# Patient Record
Sex: Female | Born: 1950 | Race: White | Hispanic: No | Marital: Married | Smoking: Never smoker
Health system: Southern US, Community
[De-identification: ages and names within clinical notes are randomized; demographics above are authoritative.]

## PROBLEM LIST (undated history)

## (undated) DIAGNOSIS — M797 Fibromyalgia: Secondary | ICD-10-CM

## (undated) DIAGNOSIS — J309 Allergic rhinitis, unspecified: Secondary | ICD-10-CM

## (undated) DIAGNOSIS — J45909 Unspecified asthma, uncomplicated: Secondary | ICD-10-CM

## (undated) DIAGNOSIS — F319 Bipolar disorder, unspecified: Secondary | ICD-10-CM

## (undated) DIAGNOSIS — J329 Chronic sinusitis, unspecified: Secondary | ICD-10-CM

## (undated) HISTORY — DX: Fibromyalgia: M79.7

## (undated) HISTORY — DX: Allergic rhinitis, unspecified: J30.9

## (undated) HISTORY — PX: TOTAL ABDOMINAL HYSTERECTOMY: SHX209

## (undated) HISTORY — PX: ABDOMINAL HYSTERECTOMY: SHX81

## (undated) HISTORY — DX: Unspecified asthma, uncomplicated: J45.909

## (undated) HISTORY — DX: Chronic sinusitis, unspecified: J32.9

## (undated) HISTORY — DX: Bipolar disorder, unspecified: F31.9

---

## 1971-04-22 DIAGNOSIS — M797 Fibromyalgia: Secondary | ICD-10-CM | POA: Insufficient documentation

## 1997-08-07 ENCOUNTER — Ambulatory Visit: Admission: RE | Admit: 1997-08-07 | Discharge: 1997-08-07 | Payer: Self-pay

## 1999-04-22 ENCOUNTER — Encounter: Admission: RE | Admit: 1999-04-22 | Discharge: 1999-04-22 | Payer: Self-pay | Admitting: Family Medicine

## 1999-04-22 ENCOUNTER — Encounter: Payer: Self-pay | Admitting: Family Medicine

## 1999-05-06 ENCOUNTER — Encounter: Admission: RE | Admit: 1999-05-06 | Discharge: 1999-05-06 | Payer: Self-pay | Admitting: Family Medicine

## 1999-05-06 ENCOUNTER — Encounter: Payer: Self-pay | Admitting: Family Medicine

## 1999-10-17 ENCOUNTER — Encounter: Admission: RE | Admit: 1999-10-17 | Discharge: 1999-10-17 | Payer: Self-pay | Admitting: Family Medicine

## 1999-10-17 ENCOUNTER — Encounter: Payer: Self-pay | Admitting: Family Medicine

## 2000-04-30 ENCOUNTER — Encounter: Payer: Self-pay | Admitting: Family Medicine

## 2000-04-30 ENCOUNTER — Encounter: Admission: RE | Admit: 2000-04-30 | Discharge: 2000-04-30 | Payer: Self-pay | Admitting: Family Medicine

## 2001-05-15 ENCOUNTER — Encounter: Admission: RE | Admit: 2001-05-15 | Discharge: 2001-05-15 | Payer: Self-pay | Admitting: Unknown Physician Specialty

## 2001-05-15 ENCOUNTER — Encounter: Payer: Self-pay | Admitting: Unknown Physician Specialty

## 2002-06-18 ENCOUNTER — Encounter: Payer: Self-pay | Admitting: Unknown Physician Specialty

## 2002-06-18 ENCOUNTER — Encounter: Admission: RE | Admit: 2002-06-18 | Discharge: 2002-06-18 | Payer: Self-pay | Admitting: Unknown Physician Specialty

## 2005-09-29 HISTORY — PX: JOINT REPLACEMENT: SHX530

## 2010-05-21 ENCOUNTER — Encounter: Payer: Self-pay | Admitting: Unknown Physician Specialty

## 2010-12-01 DIAGNOSIS — R0789 Other chest pain: Secondary | ICD-10-CM | POA: Insufficient documentation

## 2010-12-01 DIAGNOSIS — E785 Hyperlipidemia, unspecified: Secondary | ICD-10-CM | POA: Insufficient documentation

## 2011-01-11 ENCOUNTER — Ambulatory Visit (INDEPENDENT_AMBULATORY_CARE_PROVIDER_SITE_OTHER): Payer: Medicare Other | Admitting: Psychiatry

## 2011-01-11 DIAGNOSIS — F5105 Insomnia due to other mental disorder: Secondary | ICD-10-CM

## 2011-01-11 DIAGNOSIS — F489 Nonpsychotic mental disorder, unspecified: Secondary | ICD-10-CM

## 2011-01-11 DIAGNOSIS — F314 Bipolar disorder, current episode depressed, severe, without psychotic features: Secondary | ICD-10-CM

## 2011-01-25 ENCOUNTER — Encounter (INDEPENDENT_AMBULATORY_CARE_PROVIDER_SITE_OTHER): Payer: Medicare Other | Admitting: Psychiatry

## 2011-01-25 DIAGNOSIS — F332 Major depressive disorder, recurrent severe without psychotic features: Secondary | ICD-10-CM

## 2011-02-24 ENCOUNTER — Encounter (INDEPENDENT_AMBULATORY_CARE_PROVIDER_SITE_OTHER): Payer: Medicare Other | Admitting: Psychiatry

## 2011-02-24 DIAGNOSIS — F314 Bipolar disorder, current episode depressed, severe, without psychotic features: Secondary | ICD-10-CM

## 2011-02-26 ENCOUNTER — Encounter (HOSPITAL_COMMUNITY): Payer: Self-pay | Admitting: Psychiatry

## 2011-03-02 DIAGNOSIS — J329 Chronic sinusitis, unspecified: Secondary | ICD-10-CM

## 2011-03-02 HISTORY — DX: Chronic sinusitis, unspecified: J32.9

## 2011-03-08 ENCOUNTER — Other Ambulatory Visit (HOSPITAL_COMMUNITY): Payer: Self-pay | Admitting: Psychiatry

## 2011-03-08 DIAGNOSIS — F319 Bipolar disorder, unspecified: Secondary | ICD-10-CM

## 2011-03-08 MED ORDER — QUETIAPINE FUMARATE ER 150 MG PO TB24: ORAL_TABLET | ORAL | Status: DC

## 2011-03-09 ENCOUNTER — Encounter (HOSPITAL_COMMUNITY): Payer: Self-pay | Admitting: Psychology

## 2011-03-27 ENCOUNTER — Ambulatory Visit (INDEPENDENT_AMBULATORY_CARE_PROVIDER_SITE_OTHER): Payer: Medicare Other | Admitting: Psychiatry

## 2011-03-27 ENCOUNTER — Encounter (HOSPITAL_COMMUNITY): Payer: Self-pay | Admitting: Psychiatry

## 2011-03-27 VITALS — BP 126/66 | HR 55 | Ht 66.0 in | Wt 179.0 lb

## 2011-03-27 DIAGNOSIS — F3176 Bipolar disorder, in full remission, most recent episode depressed: Secondary | ICD-10-CM

## 2011-03-27 DIAGNOSIS — Z79899 Other long term (current) drug therapy: Secondary | ICD-10-CM

## 2011-03-27 MED ORDER — BUPROPION HCL ER (XL) 150 MG PO TB24: 150.0000 mg | ORAL_TABLET | ORAL | Status: DC

## 2011-03-27 NOTE — Progress Notes (Signed)
Pt is here with husband  She is very well groomed, very happy, smiling and spontaneous speech. She has good eye contact.  She is very organized with logical, historical family data.  She accounts for numerous relatives with bipolar disorder, depression with suicides [2].  She says she has been feeling well and that [employer] has put her on their assistance program.   She has begun driving again. She plans to see her PCP soon.  She has no problem with tThe medications. She is going to her mother's for Christmas and will be out of state.  She will return in Jan. 2013

## 2011-03-27 NOTE — Patient Instructions (Signed)
Have pharmacist call if Rx has not  Printed there   Return in  2 month

## 2011-04-04 ENCOUNTER — Other Ambulatory Visit (HOSPITAL_COMMUNITY): Payer: Self-pay | Admitting: Psychiatry

## 2011-04-04 DIAGNOSIS — F3160 Bipolar disorder, current episode mixed, unspecified: Secondary | ICD-10-CM | POA: Insufficient documentation

## 2011-04-04 DIAGNOSIS — F3176 Bipolar disorder, in full remission, most recent episode depressed: Secondary | ICD-10-CM

## 2011-04-04 DIAGNOSIS — F319 Bipolar disorder, unspecified: Secondary | ICD-10-CM

## 2011-04-04 MED ORDER — TRAZODONE HCL 100 MG PO TABS: 100.0000 mg | ORAL_TABLET | Freq: Every day | ORAL | Status: DC

## 2011-04-04 MED ORDER — QUETIAPINE FUMARATE ER 150 MG PO TB24: ORAL_TABLET | ORAL | Status: DC

## 2011-04-04 MED ORDER — BUPROPION HCL ER (XL) 150 MG PO TB24: 150.0000 mg | ORAL_TABLET | Freq: Every day | ORAL | Status: DC

## 2011-04-10 ENCOUNTER — Encounter (HOSPITAL_COMMUNITY): Payer: Self-pay

## 2011-04-10 NOTE — Telephone Encounter (Signed)
This encounter was created in error - please disregard.

## 2011-05-01 ENCOUNTER — Telehealth (HOSPITAL_COMMUNITY): Payer: Self-pay

## 2011-05-01 DIAGNOSIS — F3111 Bipolar disorder, current episode manic without psychotic features, mild: Secondary | ICD-10-CM

## 2011-05-01 NOTE — Telephone Encounter (Signed)
Pt states she is having racing thoughts and episode of being manic. I explained we do not do crisis appointments that she would need to go to the ED and be evaluated. She states she was not having SI or HI. Would like you to call her please.

## 2011-05-01 NOTE — Telephone Encounter (Signed)
Anna Tanner calls this morning and call as returned. She states that over the past few days she has noticed that she has had more racing thoughts uncontrollable manic behaviors and is asking what she should do. Her medications are reviewed and it is suggested for the next 2 days that she should increase her Seroquel X. are 150 mg 2 pills daily and she may increase her trazodone 100 mg been optional one and a half tablets or 2 tablets depending on call much control for sleep she will need. She is also asked to call Wednesday morning to report how she is doing. She is reminded that she has the crisis card and may use that after hours and on the New Year's Day holiday. She agrees to return the call Wednesday morning. Anna Tanner has been doing very well with significant control and improvement of her depressed mood. It is possible that the holiday activities have precipitated more manic, disturbed thoughts.  If necessary, medications will be reevaluated or augmented with a stronger comfortable SGA. Such as Zyprexa or Risperdal.

## 2011-05-03 ENCOUNTER — Telehealth (HOSPITAL_COMMUNITY): Payer: Self-pay

## 2011-05-03 NOTE — Telephone Encounter (Signed)
Anna Tanner is called to report that he we'll ordered a prescription of Seroquel X. are 150 mg take 1 tablet every and every evening 2 hours prior to sleep has arrived. She is invited to picked this prescription bottle up at her convenience. She also has called in this morning to report that the increase in Seroquel X. are to 2 tabs of 150 mg has helped control her manic symptoms. She feels calm and in control this morning. She says this medication will be sufficient to last until her next appointment. She has a cheerful tone of voice and reports that she feels very much in control. Prescription details will be adjusted at the next appointment

## 2011-05-29 ENCOUNTER — Ambulatory Visit (INDEPENDENT_AMBULATORY_CARE_PROVIDER_SITE_OTHER): Payer: No Typology Code available for payment source | Admitting: Psychiatry

## 2011-05-29 ENCOUNTER — Encounter (HOSPITAL_COMMUNITY): Payer: Self-pay | Admitting: Psychiatry

## 2011-05-29 DIAGNOSIS — G4700 Insomnia, unspecified: Secondary | ICD-10-CM

## 2011-05-29 DIAGNOSIS — F3176 Bipolar disorder, in full remission, most recent episode depressed: Secondary | ICD-10-CM

## 2011-05-29 MED ORDER — QUETIAPINE FUMARATE 100 MG PO TABS: 100.0000 mg | ORAL_TABLET | Freq: Two times a day (BID) | ORAL | Status: DC

## 2011-05-29 MED ORDER — TRAZODONE HCL 150 MG PO TABS: 150.0000 mg | ORAL_TABLET | Freq: Every day | ORAL | Status: DC

## 2011-05-29 MED ORDER — BUPROPION HCL ER (XL) 150 MG PO TB24: 150.0000 mg | ORAL_TABLET | Freq: Every day | ORAL | Status: DC

## 2011-05-29 NOTE — Progress Notes (Signed)
Patient ID: Anna Tanner, female   DOB: March 11, 1951, 61 y.o.   MRN: [id number] Concettina is a very happy person. She presents with a smiling affect and cheerful mood. She is very positive about the way in which her depression has been over calm and her manic, racing thoughts have been controlled. She feels that she is in an optimal control state at this time. She has resumed responsibility for managing all of her medications, is able to do her housework and usual routine, and she has resumed driving. This is relieved her husband of many of the responsibilities he assumed when she became so severely depressed. They're both pleased with the current outcome she has cut back her Seroquel to just 100 mg daily and feels that this has been a steady control of her mood. She denies any suicidal homicidal ideation and looks forward to continuing in this positive mood. She agrees to call the office or go to the emergency call numbers if she has any drastic change in her mental status.

## 2011-05-29 NOTE — Patient Instructions (Signed)
You have been given prescriptions for trazodone and Wellbutrin that will be at the [facility] pharmacy. They have refills of 2 or 3. B. Seroquel 100 mg will be faxed to be appropriate pharmacy that is dispensing mad and yelling at church the office after which she may pick it up. He is being given 3 refills on that. You have denied any suicidal or manic episodes which is something they celebrate and please remember to call [facility] [phone] or use the 800 hotline if you have any extreme distress mania racing thoughts or suicidal/homicidal thoughts.

## 2011-06-08 ENCOUNTER — Telehealth (HOSPITAL_COMMUNITY): Payer: Self-pay

## 2011-06-09 NOTE — Telephone Encounter (Signed)
Script was already faxed for reorder

## 2011-06-14 ENCOUNTER — Other Ambulatory Visit (HOSPITAL_COMMUNITY): Payer: Self-pay | Admitting: Psychiatry

## 2011-06-19 ENCOUNTER — Telehealth (HOSPITAL_COMMUNITY): Payer: Self-pay

## 2011-06-19 NOTE — Telephone Encounter (Signed)
Pt has not received serequel in the mail yet and she will run out tonight. She states she cannot afford to pick up the ones you called in because for 5 pills it would be $150 and she cannot afford that. Please call

## 2011-06-20 ENCOUNTER — Other Ambulatory Visit (HOSPITAL_COMMUNITY): Payer: Self-pay | Admitting: Psychiatry

## 2011-06-20 DIAGNOSIS — F319 Bipolar disorder, unspecified: Secondary | ICD-10-CM

## 2011-06-20 MED ORDER — QUETIAPINE FUMARATE ER 150 MG PO TB24: 150.0000 mg | ORAL_TABLET | Freq: Two times a day (BID) | ORAL | Status: DC

## 2011-06-20 NOTE — Progress Notes (Signed)
[employer]  Contacted for patient assistance program.  Application is to be completed for 2013  Pt is contact for required documentation

## 2011-07-14 ENCOUNTER — Ambulatory Visit (INDEPENDENT_AMBULATORY_CARE_PROVIDER_SITE_OTHER): Payer: No Typology Code available for payment source | Admitting: Psychiatry

## 2011-07-14 ENCOUNTER — Encounter (HOSPITAL_COMMUNITY): Payer: Self-pay | Admitting: Psychiatry

## 2011-07-14 ENCOUNTER — Telehealth (HOSPITAL_COMMUNITY): Payer: Self-pay

## 2011-07-14 VITALS — BP 124/74 | HR 62 | Ht 65.0 in | Wt 180.0 lb

## 2011-07-14 DIAGNOSIS — F319 Bipolar disorder, unspecified: Secondary | ICD-10-CM

## 2011-07-14 MED ORDER — QUETIAPINE FUMARATE ER 150 MG PO TB24: 150.0000 mg | ORAL_TABLET | Freq: Every morning | ORAL | Status: DC

## 2011-07-14 MED ORDER — QUETIAPINE FUMARATE ER 200 MG PO TB24: 200.0000 mg | ORAL_TABLET | Freq: Every day | ORAL | Status: DC

## 2011-07-14 NOTE — Progress Notes (Signed)
[facility] MD Progress Note  07/14/2011 10:28 AM  IDENTIFYING DATA: Anna Tanner  IS A 61 YRS OLD WHITE  FEMALE. CLINICAL HISTORY PRESENTING CHIEF COMPLAINT:  "I was overmedicated"   HISTORY OF CURRENT ILLNESS: Anna Tanner  is a 61 y/o female with a past psychiatric history significant for Bipolar I Disorder. The patient is referred for psychiatric services for medication management.  The patient reports no current stressors.  Her husband is here and reports some irritabilty for the past two weeks. In the area of affective symptoms, patient appears euthymic but reports irritability herself.  Patient denies current suicidal ideation, intent, or plan. Patient denies current homicidal ideation, intent, or plan. Patient denies auditory hallucinations. Patient denies visual hallucinations. Patient denies symptoms of paranoia. Patient states sleep is good, with approximately 9-12 hours of sleep per night. Appetite is fair. Energy level is poor. Patient endorses symptoms of anhedonia. Patient endorses hopelessness, helplessness, or guilt.   Denies any recent episodes consistent with mania, particularly decreased need for sleep with increased energy, grandiosity, impulsivity, hyperverbal and pressured speech, or increased productivity. Denies any recent symptoms consistent with psychosis, particularly auditory or visual hallucinations, thought broadcasting/insertion/withdrawal, or ideas of reference. Also denies excessive worry to the point of physical symptoms as well as any panic attacks. Denies any history of trauma or symptoms consistent with PTSD such as flashbacks, nightmares, hypervigilance, feelings of numbness or inability to connect with others.     PAST PSYCHIATRIC HISTORY:  Patient has been admitted twice times to an inpatient psychiatric facility.  Her last admission was on 3 years ago.  Patient has never attempted suicide, but has suicidal ideation 2.5 years ago.  Patient has had outpatient  mental health treatment since 2011.  Patient has not attended 28 day programs.  Patient has not attended AA programs.  Patient has not attended NA programs.  Patient has had a trial of the following psychotropic medications:  Lithium-felt sick; Abilify at 15 mg-didn't work. Patient is currently on the following psychotropic medications:  Quetiapine, Bupropion, Trazodone  HISTORY OF SUICIDAL ACTS AND SELF-HARM:  None.   HISTORY OF VIOLENCE/ASSAULTING OTHERS/LEGAL PROBLEMS: No current legal issues.   SUBSTANCE USE HISTORY:  Caffeine: Coffee 1 cups per day. Nicotine: Patient denies.  Alcohol: Patient denies.  Illicit Drugs: Patient denies.   MENTAL ILLNESS AND SUBSTANCE ABUSE IN FAMILY MEMBERS:  Psychiatric illness: Bipolar Disorder-Father and two brothers; Mother-Depression Substance abuse: Illicit drug-one brother; Ship broker Suicides: Brother-suicidal gesture    MILITARY HISTORY: None  MEDICAL INFORMATION  a) CURRENT MEDICAL PROBLEMS:  Past Medical History  Diagnosis Date  . Sinusitis 03/02/11    affects all sinuses     b) CURRENT SIGNIFICANT PAIN PROBLEMS: Yes-fibromyalgia  c) NUTRITION ASSESSMENT: well nourished d) CURRENT PRESCRIBED MEDICATIONS:  Current Medications: Current Outpatient Prescriptions  Medication Sig Dispense Refill  . azelastine (ASTELIN) 137 MCG/SPRAY nasal spray       . baclofen (LIORESAL) 10 MG tablet       . buPROPion (WELLBUTRIN XL) 150 MG 24 hr tablet Take 1 tablet (150 mg total) by mouth daily after breakfast.  30 tablet  2  . lovastatin (MEVACOR) 20 MG tablet Take 20 mg by mouth once.      Marland Kitchen QUEtiapine Fumarate (SEROQUEL XR) 150 MG 24 hr tablet Take 1 tablet (150 mg total) by mouth 2 (two) times daily.  60 tablet  11  . traMADol (ULTRAM) 50 MG tablet       . traZODone (DESYREL) 150 MG tablet Take 150  mg by mouth once. Take tablets two at bedtime      . donepezil (ARICEPT) 10 MG tablet       . doxycycline (VIBRAMYCIN) 100 MG  capsule       . GAVILYTE-N WITH FLAVOR PACK 420 G solution       . ipratropium (ATROVENT) 0.06 % nasal spray       . levofloxacin (LEVAQUIN) 500 MG tablet       . methylphenidate (RITALIN) 10 MG tablet       . VENTOLIN HFA 108 (90 BASE) MCG/ACT inhaler        e) CURRENT OTC MEDICATIONS  None ALLERGIES/ ADVERSE REACTIONS:  Allergies  Allergen Reactions  . Butazolidin (Phenylbutazone) Other (See Comments)    Oral ulcers  . Morphine And Related Nausea And Vomiting  . Nsaids Other (See Comments)    Oral ulcers  . Pseudoephedrine Swelling    Tongue swells    Vital Signs:Blood pressure 124/74, pulse 62, height 5\' 5"  (1.651 m), weight 180 lb (81.647 kg).  Current Medications: Current Outpatient Prescriptions  Medication Sig Dispense Refill  . azelastine (ASTELIN) 137 MCG/SPRAY nasal spray       . baclofen (LIORESAL) 10 MG tablet       . buPROPion (WELLBUTRIN XL) 150 MG 24 hr tablet Take 1 tablet (150 mg total) by mouth daily after breakfast.  30 tablet  2  . lovastatin (MEVACOR) 20 MG tablet Take 20 mg by mouth once.      Marland Kitchen QUEtiapine Fumarate (SEROQUEL XR) 150 MG 24 hr tablet Take 1 tablet (150 mg total) by mouth 2 (two) times daily.  60 tablet  11  . traMADol (ULTRAM) 50 MG tablet       . traZODone (DESYREL) 150 MG tablet Take 150 mg by mouth once. Take tablets two at bedtime      . donepezil (ARICEPT) 10 MG tablet       . doxycycline (VIBRAMYCIN) 100 MG capsule       . GAVILYTE-N WITH FLAVOR PACK 420 G solution       . ipratropium (ATROVENT) 0.06 % nasal spray       . levofloxacin (LEVAQUIN) 500 MG tablet       . methylphenidate (RITALIN) 10 MG tablet       . VENTOLIN HFA 108 (90 BASE) MCG/ACT inhaler         Lab Results: No results found for this or any previous visit (from the past 48 hour(s)).  MENTAL STATUS EXAM: Mental Status Examination/Evaluation: Objective:  Appearance: Well Groomed  Eye Contact::  Good  Speech:  Clear and Coherent  Volume:  Normal  Mood:   Depressed and Dysphoric  Affect:  Appropriate  Thought Process:  Coherent, Intact, Linear and Logical  Orientation:  Full  Thought Content:  WDL  Suicidal Thoughts:  No  Homicidal Thoughts:  No  Memory:  Immediate;   Good Recent;   Good Remote;   Good  Judgement:  Good  Insight:  Fair  Psychomotor Activity:  Normal  Concentration:  Good  Recall:  Good  Akathisia:  No  Handed:  Left  AIMS (if indicated):     Assets:  Communication Skills Desire for Improvement Housing Resilience Social Support     ASSESSMENT:  Diagnosis:   Axis I: Bipolar, Depressed Axis II: No diagnosis Axis III:  Past Medical History  Diagnosis Date  . Sinusitis 03/02/11    affects all sinuses  Axis IV: None Axis V: 51-60 moderate  symptoms  PLAN:   1. Affirm with the patient that the medications are taken as ordered. Patient  expressed understanding of how their medications were to be used.  2. Continue the following psychiatric medications as written prior to this appointment with the following changes:  a) Patient advised that she could discontinue ritalin as she has not been taking this medication. B) will increase Seroquel XR to 150 mg QAM and 200 mg QHS. I have asked the patient to call the clinic in one week regarding any improvement.  Will consider further increase by 50 mg weekly according to response and side effect tolerance. 3. Therapy: brief supportive therapy provided. Continue current services. discussed psychosocial stressors. 4. Risks and benefits, side effects and alternatives discussed with patient,she was given an opportunity to  ask questions about her medication, illness, and treatment. All current psychiatric medications have been reviewed and discussed with the patient and adjusted as clinically appropriate. The patient has been provided an accurate and updated list of the medications being now prescribed.  5. Patient told to call clinic if any problems occur. Patient advised to  go to ER  if she should develop SI/HI, side effects, or if symptoms worsen. Has crisis numbers to call if needed.   6. No labs warranted at this time. Will order fasting Blood glucose, HbA1c, and fasting Lipid profile for next visit. Will order EKG, depakote level, lithium level, tegretol level, cbc. Cmp, bmp. Liver function tests.  7. The patient was encouraged to keep all PCP and specialty clinic appointments.  8. Patient was instructed to return to clinic in 1 months.  9. . The patient expressed understanding of the plan outlined above and agrees with the plan.   Emree Locicero 07/14/2011, 10:28 AM

## 2011-07-17 ENCOUNTER — Telehealth (HOSPITAL_COMMUNITY): Payer: Self-pay

## 2011-07-18 ENCOUNTER — Telehealth (HOSPITAL_COMMUNITY): Payer: Self-pay | Admitting: Psychiatry

## 2011-07-18 MED ORDER — QUETIAPINE FUMARATE ER 200 MG PO TB24: 200.0000 mg | ORAL_TABLET | Freq: Every day | ORAL | Status: DC

## 2011-07-18 MED ORDER — TRAZODONE HCL 150 MG PO TABS: 150.0000 mg | ORAL_TABLET | Freq: Once | ORAL | Status: DC

## 2011-07-18 NOTE — Telephone Encounter (Signed)
The patient has reported that she feeling a little better with Seroquel XR 150 mg QAM and 200 mg.  She reports that she sleeps well with Trazodone 300 mg QHS, but will run out in a few days.  She reports that she does not feel sedated with this medication.  Plan:  1. Will increase Seroquel XR to 200 mg BID. 2. Will consider taper and possible DC of  Wellbutrin. The patient reports that Wellbutrin were started simultaneously and as the patient has insomnia and her symptoms of Bipolar Disorder are not well controlled a trial of Seroquel alone at itt's maximum tolerated dosage is warranted.

## 2011-07-18 NOTE — Telephone Encounter (Signed)
Will call patient and discuss the option for Seroquel immediate release for this patient.

## 2011-07-18 NOTE — Telephone Encounter (Signed)
Patient could not afford to pay for 200 mg XR prescription. Will fax increased dosage to [employer].

## 2011-07-20 ENCOUNTER — Telehealth (HOSPITAL_COMMUNITY): Payer: Self-pay | Admitting: Psychiatry

## 2011-07-20 NOTE — Telephone Encounter (Signed)
Called Anna Tanner she reports some constipation but also reports less irritatbility and reduction of depressive symptoms with increased dosage of quetiapine. She is currently taking Seroquel XR 200 mg BID.

## 2011-07-27 ENCOUNTER — Telehealth (HOSPITAL_COMMUNITY): Payer: Self-pay | Admitting: Psychiatry

## 2011-07-27 NOTE — Telephone Encounter (Signed)
Patient reports that she is taking 400 mg total. She denies sedation, SI/HI/AVH or medications side effects. She states she has constipation, which is improving. Unsure if this is due to seroquel at this time.

## 2011-07-31 ENCOUNTER — Emergency Department
Admission: EM | Admit: 2011-07-31 | Discharge: 2011-07-31 | Disposition: A | Discharge status: Home / Self Care | Payer: Medicare Other | Source: Home / Self Care

## 2011-07-31 ENCOUNTER — Encounter: Payer: Self-pay | Admitting: *Deleted

## 2011-07-31 ENCOUNTER — Telehealth (HOSPITAL_COMMUNITY): Payer: Self-pay | Admitting: Psychiatry

## 2011-07-31 DIAGNOSIS — S6980XA Other specified injuries of unspecified wrist, hand and finger(s), initial encounter: Secondary | ICD-10-CM

## 2011-07-31 DIAGNOSIS — S6990XA Unspecified injury of unspecified wrist, hand and finger(s), initial encounter: Secondary | ICD-10-CM

## 2011-07-31 DIAGNOSIS — M79644 Pain in right finger(s): Secondary | ICD-10-CM

## 2011-07-31 MED ORDER — TRAMADOL HCL 50 MG PO TABS: 50.0000 mg | ORAL_TABLET | Freq: Four times a day (QID) | ORAL | Status: AC | PRN

## 2011-07-31 NOTE — ED Provider Notes (Signed)
History     CSN: [id number]  Arrival date & time 07/31/11  1353   None     Chief Complaint  Patient presents with  . Finger Injury    Rt hand ring finger    (Consider location/radiation/quality/duration/timing/severity/associated sxs/prior treatment) HPI NAYELLY LAUGHMAN is a 61 y.o. female who sustained a right fifth digit injury today. Mechanism of injury:  Reports catching fall against car door with right hand. Immediate symptoms include pain and bleeding at distal fifth digit distally.  Symptoms have been unchanged since that time. Denies decreased finger function, no numbness or tingling.  No prior history of related finger problems.  Bandaid applied prior to arrival.     Past Medical History  Diagnosis Date  . Sinusitis 03/02/11    affects all sinuses    History reviewed. No pertinent past surgical history.  Family History  Problem Relation Age of Onset  . Bipolar disorder Father   . Bipolar disorder Brother   . Suicidality Paternal Aunt   . Depression Paternal Aunt   . Suicidality Maternal Uncle   . Depression Maternal Uncle   . Bipolar disorder Maternal Grandmother   . Bipolar disorder Paternal Grandfather   . Bipolar disorder Cousin   . Bipolar disorder Brother   . Bipolar disorder Cousin   . Bipolar disorder Cousin     History  Substance Use Topics  . Smoking status: Never Smoker   . Smokeless tobacco: Never Used  . Alcohol Use: No    OB History    Grav Para Term Preterm Abortions TAB SAB Ect Mult Living                  Review of Systems  All other systems reviewed and are negative.    Allergies  Amoxicillin; Butazolidin; Morphine and related; Nsaids; and Pseudoephedrine  Home Medications   Current Outpatient Rx  Name Route Sig Dispense Refill  . CALCIUM CARBONATE 600 MG PO TABS Oral Take 600 mg by mouth 2 (two) times daily with a meal.    . CYANOCOBALAMIN 100 MCG PO TABS Oral Take 100 mcg by mouth daily.    Marland Kitchen ESOMEPRAZOLE MAGNESIUM 40  MG PO CPDR Oral Take 40 mg by mouth daily before breakfast.    . AZELASTINE HCL 137 MCG/SPRAY NA SOLN      . BACLOFEN 10 MG PO TABS      . BUPROPION HCL ER (XL) 150 MG PO TB24 Oral Take 1 tablet (150 mg total) by mouth every morning. 30 tablet 0    Pt has just informed writer that she will get this ...  . BUPROPION HCL ER (XL) 150 MG PO TB24 Oral Take 1 tablet (150 mg total) by mouth daily after breakfast. 30 tablet 2  . DONEPEZIL HCL 10 MG PO TABS      . DOXYCYCLINE HYCLATE 100 MG PO CAPS      . GAVILYTE-N WITH FLAVOR PACK 420 G PO SOLR      . IPRATROPIUM BROMIDE 0.06 % NA SOLN      . LEVOFLOXACIN 500 MG PO TABS      . LOVASTATIN 20 MG PO TABS Oral Take 20 mg by mouth once.    . QUETIAPINE FUMARATE ER 200 MG PO TB24 Oral Take 1 tablet (200 mg total) by mouth at bedtime. Take 150 mg in the morning and 200 mg at bedtime. 30 tablet 11  . QUETIAPINE FUMARATE ER 150 MG PO TB24 Oral Take 1 tablet (150 mg  total) by mouth every morning. 60 tablet 11    To stabilize Bipolar I mania/depression cycles  . TRAMADOL HCL 50 MG PO TABS      . TRAMADOL HCL 50 MG PO TABS Oral Take 1 tablet (50 mg total) by mouth every 6 (six) hours as needed for pain. 15 tablet 0  . TRAZODONE HCL 150 MG PO TABS Oral Take 1 tablet (150 mg total) by mouth once. Take tablets two at bedtime 60 tablet 1  . VENTOLIN HFA 108 (90 BASE) MCG/ACT IN AERS        BP 104/68  Pulse 73  Temp(Src) 98.3 F (36.8 C) (Oral)  Resp 14  Ht 5\' 6"  (1.676 m)  SpO2 96%  Physical Exam  Constitutional: She is oriented to person, place, and time. Vital signs are normal. She appears well-developed and well-nourished. She is active and cooperative.  HENT:  Head: Normocephalic.  Eyes: Conjunctivae are normal. Pupils are equal, round, and reactive to light. No scleral icterus.  Neck: Trachea normal. Neck supple.  Cardiovascular: Normal rate and regular rhythm.   Pulmonary/Chest: Effort normal and breath sounds normal.  Musculoskeletal:        Right shoulder: Normal.       Left shoulder: Normal.       Right elbow: Normal.      Left elbow: Normal.       Right wrist: Normal.       Left wrist: Normal.       Right knee: Normal.       Left knee: Normal.       Arms: Neurological: She is alert and oriented to person, place, and time. She has normal strength. No cranial nerve deficit or sensory deficit. GCS eye subscore is 4. GCS verbal subscore is 5. GCS motor subscore is 6.  Skin: Skin is warm and dry.  Psychiatric: She has a normal mood and affect. Her speech is normal and behavior is normal. Judgment and thought content normal. Cognition and memory are normal.    ED Course  Procedures (including critical care time)  Labs Reviewed - No data to display No results found.   1. Finger pain, right   2. Fingernail injury       MDM  Apply ice to area x three times a day.  You may use finger splint for comfort and to protect nail from further injury. Not likely your nail will come off but it may grow back slower than normal.  Use you home Tramadol for pain.        Johnsie Kindred, NP 07/31/11 1505

## 2011-07-31 NOTE — ED Provider Notes (Signed)
Agree with exam, assessment, and plan.   Lattie Haw, MD 07/31/11 [phone]

## 2011-07-31 NOTE — ED Notes (Signed)
Pt shut her ring finger on her Rt hand in the car door.  Finger nail is hanging off and bleeding.

## 2011-07-31 NOTE — Discharge Instructions (Signed)
Wear finger splint as needed to protect fingernail .  Ice to area for twice daily.  Take home medications as needed for pain.  You are not likely to lose your nail.  It may however take some time to grow normally.

## 2011-08-07 ENCOUNTER — Telehealth (HOSPITAL_COMMUNITY): Payer: Self-pay | Admitting: Psychiatry

## 2011-08-07 NOTE — Telephone Encounter (Signed)
Spoke to the patient she states that she has one bowel movement every three days. She denies and suicidal ideation, intent, or plans. She reports that she does not feel like talking to her husband. She has #49 150 mg and #10 200 mg.  PLAN: As patient has constipation and anhedonic symptoms will lower Seroquel XR to 200 mg BID for the next three days and have asked the patient to call me back in three days.

## 2011-08-11 ENCOUNTER — Telehealth (HOSPITAL_COMMUNITY): Payer: Self-pay

## 2011-08-11 ENCOUNTER — Telehealth (HOSPITAL_COMMUNITY): Payer: Self-pay | Admitting: Psychiatry

## 2011-08-11 ENCOUNTER — Other Ambulatory Visit (HOSPITAL_COMMUNITY): Payer: Self-pay | Admitting: Psychiatry

## 2011-08-11 DIAGNOSIS — F319 Bipolar disorder, unspecified: Secondary | ICD-10-CM

## 2011-08-11 MED ORDER — RISPERIDONE 0.25 MG PO TABS: ORAL_TABLET | ORAL | Status: DC

## 2011-08-11 NOTE — Progress Notes (Signed)
Patient reports she does not want to continue seroquel XR due to constipation. Lowering the dosage did not help with constipation and at 400 mg she is beginning to have problems with irritability.  Will cross taper Seroquel with Risperidone 0.25 mg daily. If patient can afford this will titrate to effect.

## 2011-08-11 NOTE — Telephone Encounter (Signed)
Patient denies any mood swings. Patient reports some irritability. Patient denies SI/HI/AVH. Patient reports that constipation.

## 2011-08-14 ENCOUNTER — Ambulatory Visit (INDEPENDENT_AMBULATORY_CARE_PROVIDER_SITE_OTHER): Payer: No Typology Code available for payment source | Admitting: Psychiatry

## 2011-08-14 ENCOUNTER — Encounter (HOSPITAL_COMMUNITY): Payer: Self-pay | Admitting: Psychiatry

## 2011-08-14 VITALS — BP 122/73 | HR 78 | Ht 66.0 in | Wt 183.0 lb

## 2011-08-14 DIAGNOSIS — F3111 Bipolar disorder, current episode manic without psychotic features, mild: Secondary | ICD-10-CM

## 2011-08-14 NOTE — Progress Notes (Signed)
Psychiatric Follow Up Adult  Patient Identification:  Anna Tanner Date of Evaluation:  08/14/2011 History of Chief Complaint:   Chief Complaint  Patient presents with  . Follow-up  . Manic Behavior    HPI Comments: HISTORY OF CURRENT ILLNESS:  Anna Tanner is a 61 y/o female with a past psychiatric history significant for Bipolar I Disorder.The patient reports that she doesn't feel as down and denies agitation at lower dose of Seroquel XR (400 mg daily) , but continues with constipation. In the area of affective symptoms, patient appears euthymic. Patient denies current suicidal ideation, intent, or plan.  Patient denies current homicidal ideation, intent, or plan.  Patient denies auditory hallucinations.  Patient denies visual hallucinations. Patient denies symptoms of paranoia. Patient states sleep is good, with approximately 11 hours of sleep per night, she reports less sedation with a lower dose of quetiapine.  Appetite is fair. Energy level is poor. Patient endorses some decrease of anhedonia.  Patient denies hopelessness, helplessness, or guilt. She reports interest in changing medications and denied previous use of risperidone or ziprasidone.  Denies any recent episodes consistent with mania, particularly decreased need for sleep with increased energy, grandiosity, impulsivity, hyperverbal and pressured speech, or increased productivity. Denies any recent symptoms consistent with psychosis, particularly auditory or visual hallucinations, thought broadcasting/insertion/withdrawal, or ideas of reference. Also denies excessive worry to the point of physical symptoms as well as any panic attacks. Denies any history of trauma or symptoms consistent with PTSD such as flashbacks, nightmares, hypervigilance, feelings of numbness or inability to connect with others.   Review of Systems Physical Exam     PAST PSYCHIATRIC HISTORY:  Patient has had a trial of the following psychotropic  medications:  Lithium-felt sick; Abilify at 15 mg-didn't work.  Has not tried Risperidone or Ziprasidone Patient is currently on the following psychotropic medications:  Quetiapine, Bupropion, Trazodone   HISTORY OF SUICIDAL ACTS AND SELF-HARM:  None.   HISTORY OF VIOLENCE/ASSAULTING OTHERS/LEGAL PROBLEMS:  No current legal issues.   SUBSTANCE USE HISTORY:  Caffeine: Coffee 1 cups per day.  Nicotine: Patient denies.  Alcohol: Patient denies.  Illicit Drugs: Patient denies.   MENTAL ILLNESS AND SUBSTANCE ABUSE IN FAMILY MEMBERS:  Reviewed   MILITARY HISTORY:  None   MEDICAL INFORMATION   Past Medical History  . Constipation      b) CURRENT SIGNIFICANT PAIN PROBLEMS: Yes-fibromyalgia  c) NUTRITION ASSESSMENT: well nourished  d) CURRENT PRESCRIBED MEDICATIONS:   Allergies:   Allergies  Allergen Reactions  . Amoxicillin   . Butazolidin (Phenylbutazone) Other (See Comments)    Oral ulcers  . Morphine And Related Nausea And Vomiting  . NSAID Other (See Comments)    Oral ulcers  . Pseudoephedrine Swelling    Tongue swells   Current Medications:  Current Outpatient Prescriptions  Medication Sig Dispense Refill  . azelastine (ASTELIN) 137 MCG/SPRAY nasal spray       . baclofen (LIORESAL) 10 MG tablet       . bupropion (WELLBUTRIN XL) 150 MG 24 hr tablet Take 1 tablet (150 mg total) by mouth every morning.  30 tablet  0  . bupropion (WELLBUTRIN XL) 150 MG 24 hr tablet Take 1 tablet (150 mg total) by mouth daily after breakfast.  30 tablet  2  . calcium carbonate (OS-CAL) 600 MG TABS Take 600 mg by mouth 2 (two) times daily with a meal.      . cyanocobalamin 100 MCG tablet Take 100 mcg by mouth daily.      Marland Kitchen  donepezil (ARICEPT) 10 MG tablet       . doxycycline (VIBRAMYCIN) 100 MG capsule       . esomeprazole (NEXIUM) 40 MG capsule Take 40 mg by mouth daily before breakfast.      . GAVILYTE-N WITH FLAVOR PACK 420 G solution       . ipratropium (ATROVENT) 0.06 % nasal  spray       . levofloxacin (LEVAQUIN) 500 MG tablet       . lovastatin (MEVACOR) 20 MG tablet Take 20 mg by mouth once.      Marland Kitchen QUEtiapine (SEROQUEL XR) 200 MG 24 hr tablet Take 1 tablet (200 mg total) by mouth at bedtime. Take 150 mg in the morning and 200 mg at bedtime.  30 tablet  11  . QUEtiapine Fumarate (SEROQUEL XR) 150 MG 24 hr tablet Take 1 tablet (150 mg total) by mouth every morning.  60 tablet  11  . risperiDONE (RISPERDAL) 0.25 MG tablet Take one tablet daily at bedtime.  30 tablet  1  . traMADol (ULTRAM) 50 MG tablet       . traZODone (DESYREL) 150 MG tablet Take 1 tablet (150 mg total) by mouth once. Take tablets two at bedtime  60 tablet  1  . VENTOLIN HFA 108 (90 BASE) MCG/ACT inhaler       . DISCONTD: methylphenidate (RITALIN) 10 MG tablet          Filed Vitals:   08/14/11 1034  BP: 122/73  Pulse: 78  Height: 5\' 6"  (1.676 m)  Weight: 183 lb (83.008 kg)     Lab Results: No results found for this or any previous visit (from the past 48 hour(s)).   MENTAL STATUS EXAM:   Objective: Appearance: Well Groomed   Patent attorney:: Good   Speech: Clear and Coherent   Volume: Normal   Mood: "So-So"  Affect: Appropriate   Thought Process: Coherent, Intact, Linear and Logical   Orientation: Full   Thought Content: WDL   Suicidal Thoughts: No   Homicidal Thoughts: No   Memory: Immediate; Good  Recent; Good  Remote; Good   Judgement: Good   Insight: Fair   Psychomotor Activity: Normal   Concentration: Good   Recall: Good   Akathisia: No   Handed: Left   AIMS (if indicated):   Assets: Communication Skills  Desire for Improvement  Housing  Resilience  Social Support   Memory: Immediate: 3/3 recent: 2/3    ASSESSMENT:  Diagnosis:  Axis I: Bipolar, Depressed  Axis II: No diagnosis  Axis III:  Constipation  Axis IV: None  Axis V: 51-60 moderate symptoms   PLAN:  1. Affirm with the patient that the medications are taken as ordered. Patient  expressed  understanding of how their medications were to be used.  2. Continue the following psychiatric medications as written prior to this appointment with the following changes:  A) Patient advised that she could discontinue ritalin as she has not been taking this medication.  B) Will taper Seroquel XR 150 mg BID at 10 AM and 3 PM C) Will titrate patient with Risperidone-starting at 0.25 mg QHS, increasing as tolerated. D) Patient advised to use Trazodone 150 mg as needed if she is not able to sleep after 2-3 hours. She was informed this medication can also cause constipation. 3. Therapy: brief supportive therapy provided. Continue current services.  4. Risks and benefits, side effects and alternatives discussed with patient,she was given an opportunity to  ask questions  about her medication, illness, and treatment. All current psychiatric medications have been reviewed and discussed with the patient and adjusted as clinically appropriate. The patient has been provided an accurate and updated list of the medications being now prescribed.  5. Patient told to call clinic if any problems occur. Patient advised to go to ER if she should develop SI/HI, side effects, or if symptoms worsen. Has crisis numbers to call if needed.  6. No labs warranted at this time. 7. The patient was encouraged to keep all PCP and specialty clinic appointments.  8. Patient was instructed to return to clinic in 1 months.  9. The patient expressed understanding of the plan outlined above and agrees with the plan.      Jacqulyn Cane, MD 4/15/201310:30 AM

## 2011-08-14 NOTE — Telephone Encounter (Signed)
Patient seen today

## 2011-08-15 ENCOUNTER — Telehealth (HOSPITAL_COMMUNITY): Payer: Self-pay | Admitting: Psychiatry

## 2011-08-16 NOTE — Telephone Encounter (Signed)
Patient reports constipation since taking Seroquel XR, will taper dose of Seroquel XR to ascertain if patient's constipation improves.

## 2011-08-17 ENCOUNTER — Telehealth (HOSPITAL_COMMUNITY): Payer: Self-pay

## 2011-08-18 NOTE — Telephone Encounter (Signed)
Called patient back, she report she is able to afford risperdone as is doing well on it.

## 2011-08-22 ENCOUNTER — Telehealth (HOSPITAL_COMMUNITY): Payer: Self-pay

## 2011-08-22 NOTE — Telephone Encounter (Signed)
Acknowledged.

## 2011-08-22 NOTE — Telephone Encounter (Signed)
Patient called. We are cross tapering quetiapine with Risperdal.  She is taking Seroquel XR 150 mg BID and Risperidone 0.25 mg QHS. She denies any side effects but admits to continued irritability. She denies any suicidal or homicidal ideation, intent, or plans. She denies any other side effects. Collateral from the patient's husband confirm the same.  PLAN:  1. Increase Risperidone to 0.5 mg. Will increase to 0.75 mg within 5-7 days and discontinue one dose of Seroquel.  2. Will monitor for return of mood symptoms and increase risperidone as tolerated. Asked patient to call clinic in 2 days.

## 2011-08-25 ENCOUNTER — Telehealth (HOSPITAL_COMMUNITY): Payer: Self-pay | Admitting: Psychiatry

## 2011-08-25 NOTE — Telephone Encounter (Signed)
The patient patient report 10 pm-8 a.m.  She denies any side effects. She reports that her mood has improved. She reports that her mood is improving. Patient denies SI/HI/AVH. The patient's husband confirms the same.   Plan: Will continue current medications. On Monday will increase risperdal and D/C one 150 mg tablet of  seroquel.

## 2011-08-28 ENCOUNTER — Telehealth (HOSPITAL_COMMUNITY): Payer: Self-pay

## 2011-08-28 NOTE — Telephone Encounter (Signed)
Will increase Risperdal 0.25 mg three tablets at bedtime. Tomorrow will discontinue one dose of Seroquel 150 mg, this is the dosage she has. She denies any further irritability, but continues to have difficulty with sleep.  Have asked patient to call back on Thursday to report results.

## 2011-08-31 ENCOUNTER — Telehealth (HOSPITAL_COMMUNITY): Payer: Self-pay

## 2011-08-31 NOTE — Telephone Encounter (Signed)
Calling to let you know about medications

## 2011-08-31 NOTE — Telephone Encounter (Signed)
Called patient regarding her medications. Left Message stating I would call her later today.

## 2011-09-01 ENCOUNTER — Telehealth (HOSPITAL_COMMUNITY): Payer: Self-pay

## 2011-09-01 DIAGNOSIS — F319 Bipolar disorder, unspecified: Secondary | ICD-10-CM

## 2011-09-01 MED ORDER — RISPERIDONE 1 MG PO TABS: ORAL_TABLET | ORAL | Status: DC

## 2011-09-01 NOTE — Telephone Encounter (Signed)
Please call pt regarding medications.

## 2011-09-01 NOTE — Telephone Encounter (Addendum)
Called patient. Patient denies SI/HI/AVH.  She reports no improvement of constipation which started in January 2013, 3 months after starting seroquel. Will increase risperidone to 1 mg Qhs. [facility] confirmed receiving the medications.

## 2011-09-04 ENCOUNTER — Telehealth (HOSPITAL_COMMUNITY): Payer: Self-pay

## 2011-09-04 NOTE — Telephone Encounter (Signed)
Does not notice a difference with risperdone 1mg  please call

## 2011-09-04 NOTE — Telephone Encounter (Signed)
Called patient. She reports that she takes Seroquel XR 150 mg QAM and Risperidone 1 mg. She reports that she has no improvement in mood. She denies any SI/HI/AVH. She reports that there has been no improvement in constipation with lower dose and now feels quetiapine is not the cause of constipation.  PLAN: 1. Will increased Seroquel XR 150 mg BID. 2. Will decrease Risperdal 1 mg-half tablet. 3. Will see patient in 2 days.

## 2011-09-06 ENCOUNTER — Ambulatory Visit (INDEPENDENT_AMBULATORY_CARE_PROVIDER_SITE_OTHER): Payer: No Typology Code available for payment source | Admitting: Psychiatry

## 2011-09-06 ENCOUNTER — Encounter (HOSPITAL_COMMUNITY): Payer: Self-pay | Admitting: Psychiatry

## 2011-09-06 VITALS — BP 126/77 | HR 72 | Ht 66.0 in | Wt 182.0 lb

## 2011-09-06 DIAGNOSIS — F3162 Bipolar disorder, current episode mixed, moderate: Secondary | ICD-10-CM

## 2011-09-06 NOTE — Progress Notes (Signed)
[facility] Follow-up Outpatient Visit  Anna Tanner 11-10-1950  Date: 09/07/11  Psychiatric Follow Up Adult  Patient Identification: Anna Tanner  Date of Evaluation: 08/14/2011  History of Chief Complaint:  Chief Complaint   Patient presents with   .  Follow-up   .  Manic Behavior    HPI Comments: HISTORY OF CURRENT ILLNESS:  Anna Tanner is a 61 y/o female with a past psychiatric history significant for Bipolar I Disorder. Today the patient reports that she has not found any improvement with risperidone, she states that she would prefer the Seroquel as she recieves this medication though [employer] through patient assistance and felt better on this medication. The patient reports only her sleep improved while cross-tapering quetiapine with risperidone, but irritability continued.  Patient denies current suicidal ideation, intent, or plan. Patient denies current homicidal ideation, intent, or plan. Patient denies auditory hallucinations. Patient denies visual hallucinations. Patient denies symptoms of paranoia. Patient states sleep is good, with approximately 7 hours of sleep per night, she reports less sedation with a lower dose of quetiapine. Appetite is fair. Energy level is poor. Patient endorses some decrease of anhedonia. Patient denies hopelessness, helplessness, or guilt. She reports interest in changing medications and denies use of ziprasidone but would require patient assistance for this medication as well as she is close to her limit with her insurance.  Denies any recent episodes consistent with mania, particularly decreased need for sleep with increased energy, grandiosity, impulsivity, hyperverbal and pressured speech, or increased productivity, but she and her husband reports continued irritability. Denies any recent symptoms consistent with psychosis, particularly auditory or visual hallucinations, thought broadcasting/insertion/withdrawal, or ideas of  reference. Also denies excessive worry to the point of physical symptoms as well as any panic attacks. Denies any history of trauma or symptoms consistent with PTSD such as flashbacks, nightmares, hypervigilance, feelings of numbness or inability to connect with others.   Review of Systems  Constitutional: Negative.   Respiratory: Negative.   Cardiovascular: Negative.   Gastrointestinal: Positive for constipation. Negative for heartburn, nausea, vomiting, abdominal pain, diarrhea, blood in stool and melena.   Filed Vitals:   09/06/11 1027  BP: 126/77  Pulse: 72  Height: 5\' 6"  (1.676 m)  Weight: 182 lb (82.555 kg)   Physical Exam  Vitals reviewed. Constitutional: She appears well-developed and well-nourished. No distress.  Skin: She is not diaphoretic.     PAST PSYCHIATRIC HISTORY:  Patient has had a trial of the following psychotropic medications:  Lithium-felt sick; Abilify at 15 mg-didn't work. Risperidone-inadequate trial but patient prefers seroquel, Has not tried  Ziprasidone  Patient is currently on the following psychotropic medications:  Quetiapine, Bupropion, Trazodone,  HISTORY OF SUICIDAL ACTS AND SELF-HARM:  None.   HISTORY OF VIOLENCE/ASSAULTING OTHERS/LEGAL PROBLEMS:  No current legal issues.   SUBSTANCE USE HISTORY:  Caffeine: Coffee 1 cups per day.  Nicotine: Patient denies.  Alcohol: Patient denies.  Illicit Drugs: Patient denies.   MENTAL ILLNESS AND SUBSTANCE ABUSE IN FAMILY MEMBERS:  Reviewed  MILITARY HISTORY:  None  MEDICAL INFORMATION  Past Medical History   .  Constipation    b) CURRENT SIGNIFICANT PAIN PROBLEMS: Yes-fibromyalgia  c) NUTRITION ASSESSMENT: well nourished  d) CURRENT PRESCRIBED MEDICATIONS:  Allergies:  Allergies   Allergen  Reactions   .  Amoxicillin    .  Butazolidin (Phenylbutazone)  Other (See Comments)     Oral ulcers   .  Morphine And Related  Nausea And Vomiting   .  NSAID  Other (See Comments)     Oral ulcers   .   Pseudoephedrine  Swelling     Tongue swells   Current Medications:  Current Outpatient Prescriptions on File Prior to Visit  Medication Sig Dispense Refill  . baclofen (LIORESAL) 10 MG tablet       . buPROPion (WELLBUTRIN XL) 150 MG 24 hr tablet Take 1 tablet (150 mg total) by mouth daily after breakfast.  30 tablet  2  . calcium carbonate (OS-CAL) 600 MG TABS Take 600 mg by mouth 2 (two) times daily with a meal.      . cyanocobalamin 100 MCG tablet Take 100 mcg by mouth daily.      Marland Kitchen doxycycline (VIBRAMYCIN) 100 MG capsule       . esomeprazole (NEXIUM) 40 MG capsule Take 40 mg by mouth daily before breakfast.      . Linaclotide 145 MCG CAPS Take 145 mg by mouth every morning.      . lovastatin (MEVACOR) 20 MG tablet Take 20 mg by mouth once.      Marland Kitchen QUEtiapine Fumarate (SEROQUEL XR) 150 MG 24 hr tablet Take 1 tablet (150 mg total) by mouth every morning.  60 tablet  11  . risperiDONE (RISPERDAL) 1 MG tablet Take one tablet daily at bedtime.  30 tablet  1  . traMADol (ULTRAM) 50 MG tablet       . traZODone (DESYREL) 150 MG tablet Take 1 tablet (150 mg total) by mouth once. Take tablets two at bedtime  60 tablet  1  . buPROPion (WELLBUTRIN XL) 150 MG 24 hr tablet Take 1 tablet (150 mg total) by mouth every morning.  30 tablet  0  . DISCONTD: methylphenidate (RITALIN) 10 MG tablet          Lab Results: No results found for this or any previous visit (from the past 48 hour(s)).  MENTAL STATUS EXAM:  Objective: Appearance: Well Groomed   Patent attorney:: Good   Speech: Clear and Coherent   Volume: Normal   Mood: "So-So"   Affect: Appropriate   Thought Process: Coherent, Intact, Linear and Logical   Orientation: Full   Thought Content: WDL   Suicidal Thoughts: No   Homicidal Thoughts: No   Memory: Immediate; Good  Recent; Good  Remote; Good   Judgement: Good   Insight: Fair   Psychomotor Activity: Normal   Concentration: Good   Recall: Good   Akathisia: No   Handed: Left   AIMS  (if indicated):   Assets: Communication Skills  Desire for Improvement  Housing  Resilience  Social Support   Memory: Immediate: 3/3 recent: 2/3   ASSESSMENT:  Diagnosis:  Axis I: Bipolar I Disorder, Mixed, moderate Axis II: No diagnosis  Axis III:  Constipation   Axis IV: None  Axis V: 51-60 moderate symptoms   PLAN:  1. Affirm with the patient that the medications are taken as ordered. Patient  expressed understanding of how their medications were to be used.  2. Continue the following psychiatric medications as written prior to this appointment with the following changes:  A) Continue Wellbutrin XL 150 mg B) Will increase Seroquel XR 150 mg BID at 10 AM and 3 PM-patient preferred this medication. C) Will discontinue Risperdal,  D) Patient advised to use Trazodone 150 mg as needed if she is not able to sleep after 2-3 hours. She was informed this medication can also cause constipation.  3. Therapy: brief supportive therapy provided. Continue current  services.  4. Risks and benefits, side effects and alternatives discussed with patient,she was given an opportunity to  ask questions about her medication, illness, and treatment. All current psychiatric medications have been reviewed and discussed with the patient and adjusted as clinically appropriate. The patient has been provided an accurate and updated list of the medications being now prescribed.  5. Patient told to call clinic if any problems occur. Patient advised to go to ER if she should develop SI/HI, side effects, or if symptoms worsen. Has crisis numbers to call if needed.  6. No labs warranted at this time.  7. The patient was encouraged to keep all PCP and specialty clinic appointments.  8. Patient was instructed to return to clinic in 1 months.  9. The patient expressed understanding of the plan outlined above and agrees with the plan.      Jacqulyn Cane, MD

## 2011-09-11 ENCOUNTER — Telehealth (HOSPITAL_COMMUNITY): Payer: Self-pay

## 2011-09-11 NOTE — Telephone Encounter (Signed)
Called patient at [phone]. The patient report that her mother reports that she has been doing better a home, Her mother who spoke to this provider with the patient's verbal consent agrees,

## 2011-09-13 ENCOUNTER — Telehealth (HOSPITAL_COMMUNITY): Payer: Self-pay

## 2011-09-13 ENCOUNTER — Other Ambulatory Visit (HOSPITAL_COMMUNITY): Payer: Self-pay | Admitting: Psychiatry

## 2011-09-13 NOTE — Telephone Encounter (Signed)
Addended by: Larena Sox on: 09/13/2011 05:22 PM   Modules accepted: Orders

## 2011-09-13 NOTE — Telephone Encounter (Signed)
Called patient back. She reports she is doing well and is planning to move.

## 2011-09-13 NOTE — Telephone Encounter (Signed)
Will be back home around 3 today. All is going well. Please call

## 2011-09-14 ENCOUNTER — Telehealth (HOSPITAL_COMMUNITY): Payer: Self-pay

## 2011-09-14 NOTE — Telephone Encounter (Signed)
The patient asked if this provider would call his mother, Mrs. Fauber.  I called the patient's mother and she reports that the patient first became sick 2-3 years ago.  The patient's mother reports that she is nice to her but has always been mean to her husband but is able to be nice to her other family members.  Ms. Ross Ludwig mother reports that the patient has a family history of constipation.  Ms. Ross Ludwig endorses that the patient is impulsive at times.  PLAN: I have recommended marital counseling for this patient but patient declined.

## 2011-09-15 ENCOUNTER — Telehealth (HOSPITAL_COMMUNITY): Payer: Self-pay

## 2011-09-15 NOTE — Telephone Encounter (Signed)
Called patient back.

## 2011-09-18 ENCOUNTER — Other Ambulatory Visit (HOSPITAL_COMMUNITY): Payer: Self-pay

## 2011-09-18 DIAGNOSIS — F3176 Bipolar disorder, in full remission, most recent episode depressed: Secondary | ICD-10-CM

## 2011-09-18 MED ORDER — BUPROPION HCL ER (XL) 150 MG PO TB24: 150.0000 mg | ORAL_TABLET | Freq: Every day | ORAL | Status: DC

## 2011-09-18 NOTE — Telephone Encounter (Signed)
Will refill Wellbutrin XL 150 mg daily #30 with one refill.

## 2011-10-04 ENCOUNTER — Ambulatory Visit (INDEPENDENT_AMBULATORY_CARE_PROVIDER_SITE_OTHER): Payer: No Typology Code available for payment source | Admitting: Psychiatry

## 2011-10-04 ENCOUNTER — Encounter (HOSPITAL_COMMUNITY): Payer: Self-pay | Admitting: Psychiatry

## 2011-10-04 VITALS — BP 98/62 | HR 73 | Ht 66.0 in | Wt 182.0 lb

## 2011-10-04 DIAGNOSIS — F3176 Bipolar disorder, in full remission, most recent episode depressed: Secondary | ICD-10-CM

## 2011-10-04 DIAGNOSIS — F3162 Bipolar disorder, current episode mixed, moderate: Secondary | ICD-10-CM

## 2011-10-04 MED ORDER — TRAZODONE HCL 150 MG PO TABS: 150.0000 mg | ORAL_TABLET | Freq: Once | ORAL | Status: DC

## 2011-10-04 MED ORDER — QUETIAPINE FUMARATE ER 150 MG PO TB24: 150.0000 mg | ORAL_TABLET | Freq: Two times a day (BID) | ORAL | Status: DC

## 2011-10-04 MED ORDER — BUPROPION HCL ER (XL) 150 MG PO TB24: 150.0000 mg | ORAL_TABLET | Freq: Two times a day (BID) | ORAL | Status: DC

## 2011-10-04 NOTE — Progress Notes (Signed)
[facility]al [facility] Follow-up Outpatient Visit  IBETH FAHMY 14-Aug-1950  Date: 10/04/11  Psychiatric Follow Up Adult  Patient Identification: Anna Tanner   History of Chief Complaint:  Chief Complaint   Patient presents with   .  Follow-up   .  Manic Behavior    HPI Comments: HISTORY OF CURRENT ILLNESS:  Ms. Ackerley is a 61 y/o female with a past psychiatric history significant for Bipolar I Disorder. The patient reports she is doing fairly well but misses feeling manic.  She reports that she has no reason to feel bad in life, and feels as if she has everything she needs, but still feels sad.  Patient denies current suicidal ideation, intent, or plan. Patient denies current homicidal ideation, intent, or plan. Patient denies auditory hallucinations. Patient denies visual hallucinations. Patient denies symptoms of paranoia. Patient states sleep is poor, with approximately 10 hours of sleep per night. Appetite is fair. Energy level is poor. Patient endorses some decrease of anhedonia. Patient denies hopelessness, helplessness, or guilt. She reports interest in changing medications and denies use of ziprasidone but would require patient assistance for this medication as well as she is close to her limit with her insurance.   Denies any recent episodes consistent with mania, particularly decreased need for sleep with increased energy, grandiosity, impulsivity, hyperverbal and pressured speech, or increased productivity, but she and her husband reports continued irritability. Denies any recent symptoms consistent with psychosis, particularly auditory or visual hallucinations, thought broadcasting/insertion/withdrawal, or ideas of reference. Also denies excessive worry to the point of physical symptoms as well as any panic attacks. Denies any history of trauma or symptoms consistent with PTSD such as flashbacks, nightmares, hypervigilance, feelings of numbness or inability to connect with  others.   Filed Vitals:   10/04/11 1013  BP: 98/62  Pulse: 73  Height: 5\' 6"  (1.676 m)  Weight: 182 lb (82.555 kg)    Physical Exam  Vitals reviewed.  Constitutional: She appears well-developed and well-nourished. No distress.  Skin: She is not diaphoretic.   PAST PSYCHIATRIC HISTORY:  Patient has had a trial of the following psychotropic medications:  Lithium-felt sick; Abilify at 15 mg-didn't work. Risperidone-inadequate trial but patient prefers seroquel, Has not tried Ziprasidone  Patient is currently on the following psychotropic medications:  Quetiapine, Bupropion, Trazodone,   HISTORY OF SUICIDAL ACTS AND SELF-HARM:  None.   HISTORY OF VIOLENCE/ASSAULTING OTHERS/LEGAL PROBLEMS:  No current legal issues.   SUBSTANCE USE HISTORY:  Caffeine: Coffee 1 cups per day.  Nicotine: Patient denies.  Alcohol: Patient denies.  Illicit Drugs: Patient denies.   MENTAL ILLNESS AND SUBSTANCE ABUSE IN FAMILY MEMBERS:  Reviewed   MILITARY HISTORY:  None   MEDICAL INFORMATION  Past Medical History   .  Constipation    b) CURRENT SIGNIFICANT PAIN PROBLEMS: Yes-fibromyalgia  c) NUTRITION ASSESSMENT: well nourished   Allergies:  Allergies   Allergen  Reactions   .  Amoxicillin    .  Butazolidin (Phenylbutazone)  Other (See Comments)     Oral ulcers   .  Morphine And Related  Nausea And Vomiting   .  NSAID  Other (See Comments)     Oral ulcers   .  Pseudoephedrine  Swelling     Tongue swells    Current Medications:  Current Outpatient Prescriptions on File Prior to Visit  Medication Sig Dispense Refill  . baclofen (LIORESAL) 10 MG tablet       . calcium carbonate (OS-CAL) 600 MG  TABS Take 600 mg by mouth 2 (two) times daily with a meal.      . cyanocobalamin 100 MCG tablet Take 100 mcg by mouth daily.      Marland Kitchen doxycycline (VIBRAMYCIN) 100 MG capsule       . esomeprazole (NEXIUM) 40 MG capsule Take 40 mg by mouth daily before breakfast.      . Linaclotide 145 MCG CAPS  Take 145 mg by mouth every morning.      . lovastatin (MEVACOR) 20 MG tablet Take 20 mg by mouth once.      Marland Kitchen QUEtiapine Fumarate (SEROQUEL XR) 150 MG 24 hr tablet Take 1 tablet (150 mg total) by mouth 2 (two) times daily. Take at 7 AM and 2 PM.  60 tablet    . buPROPion (WELLBUTRIN XL) 150 MG 24 hr tablet Take 1 tablet (150 mg total) by mouth every morning.  30 tablet  0  . traZODone (DESYREL) 150 MG tablet Take 1 tablet (150 mg total) by mouth at bedtime.  30 tablet  2  . DISCONTD: methylphenidate (RITALIN) 10 MG tablet         Lab Results: No results found for this or any previous visit (from the past 48 hour(s)).   ]MENTAL STATUS EXAM:  Objective: Appearance: Well Groomed   Eye Contact:: Good   Speech: Clear and Coherent   Volume: Normal   Mood: "So-So"   Affect: Appropriate   Thought Process: Coherent, Intact, Linear and Logical   Orientation: Full   Thought Content: WDL   Suicidal Thoughts: No   Homicidal Thoughts: No   Memory: Immediate; Good  Recent; Good  Remote; Good   Judgement: Good   Insight: Fair   Psychomotor Activity: Normal   Concentration: Good   Recall: Good   Akathisia: No   Handed: Left   AIMS (if indicated):   Assets: Communication Skills  Desire for Improvement  Housing  Resilience  Social Support   Memory: Immediate: 3/3 recent: 2/3    ASSESSMENT:  Diagnosis:  Axis I: Bipolar I Disorder, Mixed, moderate  Axis II: No diagnosis  Axis III:  Constipation   Axis IV: None  Axis V: 51-60 moderate symptoms   PLAN:  1. Affirm with the patient that the medications are taken as ordered. Patient  expressed understanding of how their medications were to be used.  2. Continue the following psychiatric medications as written prior to this appointment with the following changes:  A) Increase Wellbutrin XL 150 mg one in the morning and one tablet at 2 PM. Asked patient and patient's husband to decrease dosage and call this provider if patient becomes more  irritable. B) Contonue Seroquel XR 150 mg BID at 10 AM and 3 PM-patient preferred this medication.  Patient has 9 refills of this medication through [facility]. Called in a [phone], RX: [id number] C Patient advised to use Trazodone 150 mg as needed if she is not able to sleep after 2-3 hours. She was informed this medication can also cause constipation.  3. Therapy: brief supportive therapy provided. Discussed psychosocial stressors. Advised patient to start individual counseling. 4. Risks and benefits, side effects and alternatives discussed with patient,she was given an opportunity to  ask questions about her medication, illness, and treatment. All current psychiatric medications have been reviewed and discussed with the patient and adjusted as clinically appropriate. The patient has been provided an accurate and updated list of the medications being now prescribed.  5. Patient told to call clinic if  any problems occur. Patient advised to go to ER if she should develop SI/HI, side effects, or if symptoms worsen. Has crisis numbers to call if needed.  6. No labs warranted at this time.  7. The patient was encouraged to keep all PCP and specialty clinic appointments.  8. Patient was instructed to return to clinic in 1 months.  9. The patient expressed understanding of the plan outlined above and agrees with the plan.   Jacqulyn Cane, MD

## 2011-10-05 ENCOUNTER — Encounter (HOSPITAL_COMMUNITY): Payer: Self-pay | Admitting: Psychiatry

## 2011-11-01 ENCOUNTER — Ambulatory Visit (INDEPENDENT_AMBULATORY_CARE_PROVIDER_SITE_OTHER): Payer: No Typology Code available for payment source | Admitting: Psychiatry

## 2011-11-01 ENCOUNTER — Encounter (HOSPITAL_COMMUNITY): Payer: Self-pay | Admitting: Psychiatry

## 2011-11-01 ENCOUNTER — Telehealth (HOSPITAL_COMMUNITY): Payer: Self-pay

## 2011-11-01 VITALS — BP 99/73 | HR 75 | Ht 66.0 in | Wt 182.0 lb

## 2011-11-01 DIAGNOSIS — F3176 Bipolar disorder, in full remission, most recent episode depressed: Secondary | ICD-10-CM

## 2011-11-01 DIAGNOSIS — F3162 Bipolar disorder, current episode mixed, moderate: Secondary | ICD-10-CM

## 2011-11-01 MED ORDER — TRAZODONE HCL 150 MG PO TABS: 150.0000 mg | ORAL_TABLET | Freq: Once | ORAL | Status: DC

## 2011-11-01 MED ORDER — BUPROPION HCL ER (XL) 150 MG PO TB24: 150.0000 mg | ORAL_TABLET | Freq: Two times a day (BID) | ORAL | Status: DC

## 2011-11-01 MED ORDER — TRAZODONE HCL 150 MG PO TABS: 300.0000 mg | ORAL_TABLET | Freq: Once | ORAL | Status: DC

## 2011-11-01 NOTE — Progress Notes (Signed)
[facility]al Health Follow-up Outpatient Visit  Anna Tanner 1950/11/06  Chief Complaint  Patient presents with  . Follow-up   History of Chief Complaint  HPI Comments: HISTORY OF CURRENT ILLNESS:  Anna Tanner is a 61 y/o female with a past psychiatric history significant for Bipolar I Disorder. The patient reports she is doing well.  Patient denies current suicidal ideation, intent, or plan. Patient denies current homicidal ideation, intent, or plan. Patient denies auditory hallucinations. Patient denies visual hallucinations. Patient denies symptoms of paranoia. Patient states sleep is improved, with approximately 8-9 hours of sleep per night. Appetite is fair. She continues to report that her energy level is improved but not as high as she would like it to be. Patient endorses further improvement of anhedonia. Patient denies hopelessness, helplessness, or guilt.   Denies any recent episodes consistent with mania, particularly decreased need for sleep with increased energy, grandiosity, impulsivity, hyperverbal and pressured speech, or increased productivity, but she and her husband reports continued irritability. Denies any recent symptoms consistent with psychosis, particularly auditory or visual hallucinations, thought broadcasting/insertion/withdrawal, or ideas of reference. Also denies excessive worry to the point of physical symptoms as well as any panic attacks. Denies any history of trauma or symptoms consistent with PTSD such as flashbacks, nightmares, hypervigilance, feelings of numbness or inability to connect with others.   Collateral information from her husband, who is present at the interview, reveal no concerns from him.   Review of Systems  Constitutional: Negative for fever, chills, weight loss and diaphoresis.  Respiratory: Negative for cough, hemoptysis, sputum production, shortness of breath and wheezing.   Cardiovascular: Negative for chest pain,  palpitations, orthopnea, claudication, leg swelling and PND.  Neurological: Negative for weakness.   Filed Vitals:   11/01/11 1040  BP: 99/73  Pulse: 75  Height: 5\' 6"  (1.676 m)  Weight: 182 lb (82.555 kg)   Physical Exam  Vitals reviewed.  Constitutional: She appears well-developed and well-nourished. No distress.  Skin: She is not diaphoretic.   PAST PSYCHIATRIC HISTORY: Reviewed Patient has had a trial of the following psychotropic medications:  Lithium-felt sick; Abilify at 15 mg-didn't work. Risperidone-inadequate trial but patient prefers seroquel, Has not tried Ziprasidone  Patient is currently on the following psychotropic medications:  Quetiapine, Bupropion, Trazodone.  HISTORY OF SUICIDAL ACTS AND SELF-HARM:  None.   HISTORY OF VIOLENCE/ASSAULTING OTHERS/LEGAL PROBLEMS:  No current legal issues.   SUBSTANCE USE HISTORY:  Caffeine: Patient denies current use. Nicotine: Patient denies.  Alcohol: Patient denies.  Illicit Drugs: Patient denies.   MENTAL ILLNESS AND SUBSTANCE ABUSE IN FAMILY MEMBERS:  Reviewed   MILITARY HISTORY: Reviewed None   MEDICAL INFORMATION Reviewed Past Medical History   .  Constipation -improved   b) CURRENT SIGNIFICANT PAIN PROBLEMS: Yes-fibromyalgia  c) NUTRITION ASSESSMENT: well nourished   Allergies: Reviewed Allergies   Allergen  Reactions   .  Amoxicillin    .  Butazolidin (Phenylbutazone)  Other (See Comments)     Oral ulcers   .  Morphine And Related  Nausea And Vomiting   .  NSAID  Other (See Comments)     Oral ulcers   .  Pseudoephedrine  Swelling     Tongue swells    Current Medications:  Current Outpatient Prescriptions on File Prior to Visit  Medication Sig Dispense Refill  . baclofen (LIORESAL) 10 MG tablet       . calcium carbonate (OS-CAL) 600 MG TABS Take 600 mg by mouth 2 (two) times  daily with a meal.      . cyanocobalamin 100 MCG tablet Take 100 mcg by mouth daily.      Marland Kitchen doxycycline (VIBRAMYCIN) 100 MG  capsule       . esomeprazole (NEXIUM) 40 MG capsule Take 40 mg by mouth daily before breakfast.      . lovastatin (MEVACOR) 20 MG tablet Take 20 mg by mouth once.      Marland Kitchen QUEtiapine Fumarate (SEROQUEL XR) 150 MG 24 hr tablet Take 1 tablet (150 mg total) by mouth 2 (two) times daily. Take at 7 AM and 2 PM.  60 tablet    . buPROPion (WELLBUTRIN XL) 150 MG 24 hr tablet Take 1 tablet (150 mg total) by mouth every morning.  30 tablet  0  . traZODone (DESYREL) 150 MG tablet Take 1 tablet (150 mg total) by mouth at bedtime.  30 tablet  2  . DISCONTD: methylphenidate (RITALIN) 10 MG tablet         Lab Results: No results found for this or any previous visit (from the past 48 hour(s)).   MENTAL STATUS EXAM:  Objective: Appearance: Well Groomed   Patent attorney:: Good   Speech: Clear and Coherent   Volume: Normal   Mood: "So-So"   Affect: Appropriate   Thought Process: Coherent, Intact, Linear and Logical   Orientation: Full   Thought Content: WDL   Suicidal Thoughts: No   Homicidal Thoughts: No   Memory: Immediate; Good  Recent; Good  Remote; Good   Judgement: Good   Insight: Fair   Psychomotor Activity: Normal   Concentration: Good   Recall: Good   Akathisia: No   Handed: Left    Memory: 3/3   Concentration: Fair as evidence by ability to repeat 4 words  AIMS (if indicated): As noted  Assets: Communication Skills  Desire for Improvement  Housing  Resilience  Social Support   Memory: Immediate: 3/3 recent: 2/3    ASSESSMENT:   Diagnosis:  Axis I: Bipolar I Disorder, Mixed, moderate  Axis II: No diagnosis  Axis III:  Constipation   Axis IV: None  Axis V: GAF: 61   PLAN:  1. Affirm with the patient that the medications are taken as ordered. Patient expressed understanding of how their medications were to be used.  2. Continue the following psychiatric medications as written prior to this appointment with the following changes:  A) Continue Wellbutrin XL 150 mg one in the  morning and one tablet at 2 PM.  B) Contonue Seroquel XR 150 mg BID at 10 AM and 3 PM-patient preferred this medication. Patient has 9 refills of this medication through [facility]. Called in a [phone], RX: [id number], at her last appointment C Patient advised to use Trazodone 150 mg as needed if she is not able to sleep after 2-3 hours. She was informed this medication can also cause constipation.  3. Therapy: brief supportive therapy provided. Discussed psychosocial stressors.  4. Risks and benefits, side effects and alternatives discussed with patient,she was given an opportunity to ask questions about her medication, illness, and treatment. All current psychiatric medications have been reviewed and discussed with the patient and adjusted as clinically appropriate. The patient has been provided an accurate and updated list of the medications being now prescribed.  5. Patient told to call clinic if any problems occur. Patient advised to go to ER if she should develop SI/HI, side effects, or if symptoms worsen. Has crisis numbers to call if needed.  6.  No labs warranted at this time.  7. The patient was encouraged to keep all PCP and specialty clinic appointments.  8. Patient was instructed to return to clinic in 1 month.  9. The patient expressed understanding of the plan outlined above and agrees with the plan.    Jacqulyn Cane, MD

## 2011-11-03 NOTE — Telephone Encounter (Signed)
New prescription has been e-prescribed.

## 2011-11-06 MED ORDER — TRAZODONE HCL 150 MG PO TABS: 300.0000 mg | ORAL_TABLET | Freq: Every day | ORAL | Status: DC

## 2011-11-06 NOTE — Addendum Note (Signed)
Addended by: Larena Sox on: 11/06/2011 12:57 PM   Modules accepted: Orders

## 2011-11-06 NOTE — Telephone Encounter (Signed)
Called patient pharmacy. Patient has received her prescription of trazodone, have sent in new prescription.

## 2011-11-20 ENCOUNTER — Telehealth (HOSPITAL_COMMUNITY): Payer: Self-pay | Admitting: Psychiatry

## 2011-11-20 HISTORY — PX: MULTIPLE TOOTH EXTRACTIONS: SHX2053

## 2011-11-22 NOTE — Telephone Encounter (Signed)
error 

## 2011-11-29 ENCOUNTER — Ambulatory Visit (INDEPENDENT_AMBULATORY_CARE_PROVIDER_SITE_OTHER): Payer: No Typology Code available for payment source | Admitting: Psychiatry

## 2011-11-29 ENCOUNTER — Encounter (HOSPITAL_COMMUNITY): Payer: Self-pay | Admitting: Psychiatry

## 2011-11-29 VITALS — BP 91/64 | HR 76 | Ht 66.0 in | Wt 183.0 lb

## 2011-11-29 DIAGNOSIS — F3176 Bipolar disorder, in full remission, most recent episode depressed: Secondary | ICD-10-CM

## 2011-11-29 DIAGNOSIS — F3162 Bipolar disorder, current episode mixed, moderate: Secondary | ICD-10-CM

## 2011-11-29 MED ORDER — BUPROPION HCL ER (XL) 300 MG PO TB24: 300.0000 mg | ORAL_TABLET | Freq: Once | ORAL | Status: DC

## 2011-11-29 NOTE — Progress Notes (Signed)
[facility] Behavioral Health Follow-up Outpatient Visit  Anna Tanner Sep 17, 1950  Date: 11/29/2011  HPI Comments:  Ms. Calamia is a 61 y/o female with a past psychiatric history significant for Bipolar I Disorder. The patient reports she is doing well.   Patient denies current suicidal ideation, intent, or plan. Patient denies current homicidal ideation, intent, or plan. Patient denies auditory hallucinations. Patient denies visual hallucinations. Patient denies symptoms of paranoia. Patient states sleep is improved, with approximately 8-9 hours of sleep per night.  Appetite is fair.  She continues to report that her energy level fair but not as high as she would like it.  Patient denies feeling of anhedonia. Patient denies hopelessness, helplessness, or guilt. She reports an improvement with symptoms of constipation, and further improvement with addition of more fiber to her diet.  Denies any recent episodes consistent with mania, particularly decreased need for sleep with increased energy, grandiosity, impulsivity, hyperverbal and pressured speech, or increased productivity, but she and her husband reports continued irritability.  Denies any recent symptoms consistent with psychosis, particularly auditory or visual hallucinations, thought broadcasting/insertion/withdrawal, or ideas of reference. Also denies excessive worry to the point of physical symptoms as well as any panic attacks. Denies any history of trauma or symptoms consistent with PTSD such as flashbacks, nightmares, hypervigilance, feelings of numbness or inability to connect with others.   Collateral information from her husband, who is present at the interview, reveal no concerns from him.   Review of Systems  Constitutional: Negative for fever, chills, weight loss and diaphoresis.  Respiratory: Negative for cough, hemoptysis, sputum production, shortness of breath and wheezing.  Cardiovascular: Negative for chest pain,  palpitations, orthopnea, claudication, leg swelling and PND.  Neurological: Negative for weakness.   Filed Vitals:   11/29/11 1025  BP: 91/64  Pulse: 76  Height: 5\' 6"  (1.676 m)  Weight: 183 lb (83.008 kg)   Physical Exam  Vitals reviewed.  Constitutional: She appears well-developed and well-nourished. No distress.  Skin: She is not diaphoretic.   PAST PSYCHIATRIC HISTORY: Reviewed  Patient has had a trial of the following psychotropic medications:  Lithium-felt sick; Abilify at 15 mg-didn't work. Risperidone-inadequate trial but patient prefers seroquel, Has not tried Ziprasidone  Patient is currently on the following psychotropic medications:  Quetiapine, Bupropion, Trazodone.   HISTORY OF SUICIDAL ACTS AND SELF-HARM: Reviewed  None.   HISTORY OF VIOLENCE/ASSAULTING OTHERS/LEGAL PROBLEMS: Reviewed  No current legal issues.   SUBSTANCE USE HISTORY:  Caffeine: Patient denies current use.  Nicotine: Patient denies.  Alcohol: Patient denies.  Illicit Drugs: Patient denies.   MENTAL ILLNESS AND SUBSTANCE ABUSE IN FAMILY MEMBERS:  Reviewed   MILITARY HISTORY: Reviewed  None   MEDICAL INFORMATION Reviewed  Past Medical History   .  Constipation -improved    b) CURRENT SIGNIFICANT PAIN PROBLEMS: Yes-fibromyalgia  c) NUTRITION ASSESSMENT: well nourished    Allergies: Reviewed   Allergen  Reactions   .  Amoxicillin    .  Butazolidin (Phenylbutazone)  Other (See Comments)     Oral ulcers   .  Morphine And Related  Nausea And Vomiting   .  NSAID  Other (See Comments)     Oral ulcers   .  Pseudoephedrine  Swelling     Tongue swells    Current Medications: Reviewed  Current Outpatient Prescriptions on File Prior to Visit  Medication Sig Dispense Refill  . baclofen (LIORESAL) 10 MG tablet       . buPROPion (WELLBUTRIN XL) 150  MG 24 hr tablet Take 1 tablet (150 mg total) by mouth 2 (two) times daily. Take one tablet at 7 AM and 2PM  60 tablet  1  . calcium carbonate  (OS-CAL) 600 MG TABS Take 600 mg by mouth 2 (two) times daily with a meal.      . cyanocobalamin 100 MCG tablet Take 100 mcg by mouth daily.      Marland Kitchen doxycycline (VIBRAMYCIN) 100 MG capsule       . esomeprazole (NEXIUM) 40 MG capsule Take 40 mg by mouth daily before breakfast.      . lovastatin (MEVACOR) 20 MG tablet Take 20 mg by mouth once.      Marland Kitchen QUEtiapine Fumarate (SEROQUEL XR) 150 MG 24 hr tablet Take 1 tablet (150 mg total) by mouth 2 (two) times daily. Take at 7 AM and 2 PM.  60 tablet    . traZODone (DESYREL) 150 MG tablet Take 2 tablets (300 mg total) by mouth at bedtime.  60 tablet  1  . buPROPion (WELLBUTRIN XL) 150 MG 24 hr tablet Take 1 tablet (150 mg total) by mouth every morning.  30 tablet  0  . traZODone (DESYREL) 150 MG tablet Take 1 tablet (150 mg total) by mouth at bedtime.  30 tablet  2  . DISCONTD: methylphenidate (RITALIN) 10 MG tablet        Lab Results: No results found for this or any previous visit (from the past 48 hour(s)).   MENTAL STATUS EXAM:  Objective: Appearance: Well Groomed   Patent attorney:: Good   Speech: Clear and Coherent   Volume: Normal   Mood: "good"   Affect: Appropriate   Thought Process: Coherent, Intact, Linear and Logical   Orientation: Full   Thought Content: WDL   Suicidal Thoughts: No   Homicidal Thoughts: No   Memory: Immediate; Good  Recent; Good  Remote; Good   Judgement: Good   Insight: Fair   Psychomotor Activity: Normal   Concentration: Good   Recall: Good   Akathisia: No   Handed: Left   Concentration: Fair as evidence by ability to repeat 4 words   AIMS (if indicated): As noted   Assets: Communication Skills  Desire for Improvement  Housing  Resilience  Social Support   Memory: Immediate: 3/3 recent: 3/3    ASSESSMENT:   Diagnosis:  Axis I: Bipolar I Disorder, Mixed, moderate  Axis II: No diagnosis  Axis III:  Constipation   Axis IV: None  Axis V: GAF: 67  PLAN:  1. Affirm with the patient that the  medications are taken as ordered. Patient expressed understanding of how their medications were to be used.  2. Continue the following psychiatric medications as written prior to this appointment with the following changes:  A) Change Wellbutrin XL 300 mg one in the morning. [facility] B) Contonue Seroquel XR 150 mg BID at 10 AM and 3 PM-patient preferred this medication. Patient has 9 refills of this medication through [facility]. Called in a [phone], RX: [id number], at her last appointment  C Patient advised to use Trazodone 150 mg as needed if she is not able to sleep after 2-3 hours. She was informed this medication can also cause constipation.  3. Therapy: brief supportive therapy provided. Discussed psychosocial stressors.  4. Risks and benefits, side effects and alternatives discussed with patient,she was given an opportunity to ask questions about her medication, illness, and treatment. All current psychiatric medications have been reviewed and discussed  with the patient and adjusted as clinically appropriate. The patient has been provided an accurate and updated list of the medications being now prescribed.  5. Patient told to call clinic if any problems occur. Patient advised to go to ER if she should develop SI/HI, side effects, or if symptoms worsen. Has crisis numbers to call if needed.  6. No labs warranted at this time.  7. The patient was encouraged to keep all PCP and specialty clinic appointments.  8. Patient was instructed to return to clinic in 3 month.  9. The patient expressed understanding of the plan outlined above and agrees with the plan.     Jacqulyn Cane, MD

## 2011-12-04 ENCOUNTER — Telehealth (HOSPITAL_COMMUNITY): Payer: Self-pay

## 2011-12-04 DIAGNOSIS — F3176 Bipolar disorder, in full remission, most recent episode depressed: Secondary | ICD-10-CM

## 2011-12-04 MED ORDER — BUPROPION HCL ER (XL) 300 MG PO TB24: 300.0000 mg | ORAL_TABLET | ORAL | Status: DC

## 2011-12-04 NOTE — Telephone Encounter (Signed)
Needs you to call pharmacy about wellbutrin prescription... [phone]

## 2011-12-04 NOTE — Telephone Encounter (Signed)
Called in prescription

## 2012-02-29 ENCOUNTER — Ambulatory Visit (HOSPITAL_COMMUNITY): Payer: Self-pay | Admitting: Psychiatry

## 2012-02-29 ENCOUNTER — Ambulatory Visit (INDEPENDENT_AMBULATORY_CARE_PROVIDER_SITE_OTHER): Payer: No Typology Code available for payment source | Admitting: Psychiatry

## 2012-02-29 ENCOUNTER — Encounter (HOSPITAL_COMMUNITY): Payer: Self-pay | Admitting: Psychiatry

## 2012-02-29 VITALS — BP 121/63 | HR 75 | Ht 66.0 in | Wt 177.0 lb

## 2012-02-29 DIAGNOSIS — F3162 Bipolar disorder, current episode mixed, moderate: Secondary | ICD-10-CM

## 2012-02-29 DIAGNOSIS — F3176 Bipolar disorder, in full remission, most recent episode depressed: Secondary | ICD-10-CM

## 2012-02-29 MED ORDER — BUPROPION HCL ER (XL) 300 MG PO TB24: 300.0000 mg | ORAL_TABLET | ORAL | Status: DC

## 2012-02-29 MED ORDER — TRAZODONE HCL 150 MG PO TABS: 300.0000 mg | ORAL_TABLET | Freq: Every day | ORAL | Status: DC

## 2012-02-29 MED ORDER — QUETIAPINE FUMARATE ER 150 MG PO TB24: 150.0000 mg | ORAL_TABLET | Freq: Two times a day (BID) | ORAL | Status: DC

## 2012-02-29 NOTE — Progress Notes (Signed)
[facility] Follow-up Outpatient Visit  Anna Tanner Oct 14, 1950  Date: 02/29/2012  HPI Comments:  Anna Tanner is a 61 y/o female with a past psychiatric history significant for Bipolar I Disorder. The patient reports she is doing well. She reports she is taking her medications and denies any side effects.    Patient denies current suicidal ideation, intent, or plan. Patient denies current homicidal ideation, intent, or plan. Patient denies auditory hallucinations. Patient denies visual hallucinations. Patient denies symptoms of paranoia. Patient states sleep is improved, with approximately 9-10 hours of sleep per night. Appetite is fair. She continues to report that her energy level fair but not as high as she would like it. Patient denies feeling of anhedonia. Patient denies hopelessness, helplessness, or guilt. She reports an improvement with symptoms of constipation.   Denies any recent episodes consistent with mania, particularly decreased need for sleep with increased energy, grandiosity, impulsivity, hyperverbal and pressured speech, or increased productivity, but she and her husband reports continued irritability. Denies any recent symptoms consistent with psychosis, particularly auditory or visual hallucinations, thought broadcasting/insertion/withdrawal, or ideas of reference. Also denies excessive worry to the point of physical symptoms as well as any panic attacks. Denies any history of trauma or symptoms consistent with PTSD such as flashbacks, nightmares, hypervigilance, feelings of numbness or inability to connect with others.   Collateral information from her husband, who is present at the interview, reveal no concerns from him.   Review of Systems  Constitutional: Negative for fever, chills, weight loss and diaphoresis.  Respiratory: Negative for cough, hemoptysis, sputum production, shortness of breath and wheezing.  Cardiovascular: Negative for chest pain,  palpitations, orthopnea, claudication, leg swelling and PND.  Neurological: Negative for weakness.   Filed Vitals:   02/29/12 1305  BP: 121/63  Pulse: 75  Height: 5\' 6"  (1.676 m)  Weight: 177 lb (80.287 kg)    Physical Exam  Vitals reviewed.  Constitutional: She appears well-developed and well-nourished. No distress.  Skin: She is not diaphoretic.   PAST PSYCHIATRIC HISTORY: Reviewed  Patient has had a trial of the following psychotropic medications:  Lithium-felt sick; Abilify at 15 mg-didn't work. Risperidone-inadequate trial but patient prefers seroquel, Has not tried Ziprasidone  Patient is currently on the following psychotropic medications:  Quetiapine, Bupropion, Trazodone.   HISTORY OF SUICIDAL ACTS AND SELF-HARM: Reviewed  None.   HISTORY OF VIOLENCE/ASSAULTING OTHERS/LEGAL PROBLEMS: Reviewed  No current legal issues.   SUBSTANCE USE HISTORY:  Caffeine: Patient denies current use.  Nicotine: Patient denies.  Alcohol: Patient denies.  Illicit Drugs: Patient denies.   MENTAL ILLNESS AND SUBSTANCE ABUSE IN FAMILY MEMBERS:  Reviewed   MILITARY HISTORY: Reviewed  None   MEDICAL INFORMATION Reviewed  Past Medical History  Diagnosis Date  . Sinusitis 03/02/11    affects all sinuses  . Constipation     Allergies: Reviewed   Allergies  Allergen Reactions  . Amoxicillin   . Butazolidin (Phenylbutazone) Other (See Comments)    Oral ulcers  . Morphine And Related Nausea And Vomiting  . Nsaids Other (See Comments)    Oral ulcers  . Pseudoephedrine Swelling    Tongue swells      Current Medications: Reviewed  Current Outpatient Prescriptions on File Prior to Visit  Medication Sig Dispense Refill  . baclofen (LIORESAL) 10 MG tablet       . buPROPion (WELLBUTRIN XL) 150 MG 24 hr tablet Take 1 tablet (150 mg total) by mouth every morning.  30 tablet  0  . buPROPion (WELLBUTRIN XL) 300 MG 24 hr tablet Take 1 tablet (300 mg total) by mouth every morning.  90  tablet  1  . calcium carbonate (OS-CAL) 600 MG TABS Take 600 mg by mouth 2 (two) times daily with a meal.      . cyanocobalamin 100 MCG tablet Take 100 mcg by mouth daily.      Marland Kitchen doxycycline (VIBRAMYCIN) 100 MG capsule       . esomeprazole (NEXIUM) 40 MG capsule Take 40 mg by mouth daily before breakfast.      . lovastatin (MEVACOR) 20 MG tablet Take 20 mg by mouth once.      Marland Kitchen QUEtiapine Fumarate (SEROQUEL XR) 150 MG 24 hr tablet Take 1 tablet (150 mg total) by mouth 2 (two) times daily. Take at 7 AM and 2 PM.  60 tablet    . traZODone (DESYREL) 150 MG tablet Take 1 tablet (150 mg total) by mouth at bedtime.  30 tablet  2  . traZODone (DESYREL) 150 MG tablet Take 2 tablets (300 mg total) by mouth at bedtime.  60 tablet  1  . DISCONTD: methylphenidate (RITALIN) 10 MG tablet         Lab Results: No results found for this or any previous visit (from the past 48 hour(s)).   MENTAL STATUS EXAM:  Objective: Appearance: Well Groomed   Patent attorney:: Good   Speech: Clear and Coherent   Volume: Normal   Mood: "fine"   Affect: Appropriate   Thought Process: Coherent, Intact, Linear and Logical   Orientation: Full   Thought Content: WDL   Suicidal Thoughts: No   Homicidal Thoughts: No   Memory: Immediate; Good  Recent; Good  Remote; Good   Judgement: Good   Insight: Fair   Psychomotor Activity: Normal   Concentration: Good   Recall: Good   Akathisia: No   Handed: Left   Concentration: Fair as evidence by ability to repeat 4 words   AIMS (if indicated): As noted   Assets: Communication Skills  Desire for Improvement  Housing  Resilience  Social Support   Memory: Immediate: 3/3 recent: 3/3   ASSESSMENT:   Diagnosis:  Axis I: Bipolar I Disorder, Mixed, moderate  Axis II: No diagnosis  Axis III:  Constipation   Axis IV: None  Axis V: GAF: 68  PLAN:  1. Affirm with the patient that the medications are taken as ordered. Patient expressed understanding of how their medications  were to be used.  2. Continue the following psychiatric medications as written prior to this appointment with the following changes:  A) Change Wellbutrin XL 300 mg one in the morning. [facility]  B) Contonue Seroquel XR 150 mg BID at 10 AM and 3 PM-patient preferred this medication. Called in a [phone], RX: [id number], Patient will need to provide paperwork to continue receiving Seroquel XR through [facility]. C Patient advised to use Trazodone 150 mg as needed if she is not able to sleep after 2-3 hours. She was informed this medication can also cause constipation.  3. Therapy: brief supportive therapy provided. Discussed psychosocial stressors.  4. Risks and benefits, side effects and alternatives discussed with patient,she was given an opportunity to ask questions about her medication, illness, and treatment. All current psychiatric medications have been reviewed and discussed with the patient and adjusted as clinically appropriate. The patient has been provided an accurate and updated list of the medications being now prescribed.  5. Patient told  to call clinic if any problems occur. Patient advised to go to ER if she should develop SI/HI, side effects, or if symptoms worsen. Has crisis numbers to call if needed.  6. No labs warranted at this time.  7. The patient was encouraged to keep all PCP and specialty clinic appointments.  8. Patient was instructed to return to clinic in 3 months.  9. The patient expressed understanding of the plan outlined above and agrees with the plan.   Jacqulyn Cane, MD

## 2012-03-14 ENCOUNTER — Telehealth (HOSPITAL_COMMUNITY): Payer: Self-pay | Admitting: Psychiatry

## 2012-03-19 NOTE — Telephone Encounter (Signed)
Sent in prescription for Bupropion.

## 2012-05-31 ENCOUNTER — Encounter (HOSPITAL_COMMUNITY): Payer: Self-pay | Admitting: Psychiatry

## 2012-05-31 ENCOUNTER — Ambulatory Visit (INDEPENDENT_AMBULATORY_CARE_PROVIDER_SITE_OTHER): Payer: No Typology Code available for payment source | Admitting: Psychiatry

## 2012-05-31 VITALS — BP 102/74 | HR 78 | Ht 66.0 in | Wt 171.0 lb

## 2012-05-31 DIAGNOSIS — F3176 Bipolar disorder, in full remission, most recent episode depressed: Secondary | ICD-10-CM

## 2012-05-31 DIAGNOSIS — F3162 Bipolar disorder, current episode mixed, moderate: Secondary | ICD-10-CM

## 2012-05-31 MED ORDER — TRAZODONE HCL 300 MG PO TABS: 300.0000 mg | ORAL_TABLET | Freq: Every day | ORAL | Status: DC

## 2012-05-31 MED ORDER — BUPROPION HCL ER (XL) 300 MG PO TB24: 300.0000 mg | ORAL_TABLET | ORAL | Status: DC

## 2012-05-31 NOTE — Progress Notes (Signed)
[facility] Health Follow-up Outpatient Visit  Anna Tanner [birth year]  Date:    [facility] Follow-up Outpatient Visit  Anna Tanner 01-Aug-1950  Date: 02/29/2012  HPI Comments:  Anna Tanner is a 62 y/o female with a past psychiatric history significant for Bipolar I Disorder. The patient has been referred to psychiatry for medication management.  The patient reports she is doing well. She reports she is taking her medications and denies any side effects.  In the are of affective symptoms patient appears euthymicPatient denies current suicidal ideation, intent, or plan. Patient denies current homicidal ideation, intent, or plan. Patient denies auditory hallucinations. Patient denies visual hallucinations. Patient denies symptoms of paranoia. Patient states sleep is good, with approximately 9-10 hours of sleep per night, taking Trazodone 300 mg at bedtime. Appetite is Tanner. She reports he energy levels are good. Patient denies feeling of anhedonia. Patient denies hopelessness, helplessness, or guilt. She reports an improvement with symptoms of constipation.   Denies any recent episodes consistent with mania, particularly decreased need for sleep with increased energy, grandiosity, impulsivity, hyperverbal and pressured speech, or increased productivity, but she and her husband reports continued irritability. Denies any recent symptoms consistent with psychosis, particularly auditory or visual hallucinations, thought broadcasting/insertion/withdrawal, or ideas of reference. Also denies excessive worry to the point of physical symptoms as well as any panic attacks. Denies any history of trauma or symptoms consistent with PTSD such as flashbacks, nightmares, hypervigilance, feelings of numbness or inability to connect with others.   Her husband who is present at the interview denies any concerns.  Review of Systems  Constitutional: Negative for fever, chills, weight loss  and diaphoresis.  Respiratory: Negative for cough, hemoptysis, sputum production, shortness of breath and wheezing.  Cardiovascular: Negative for chest pain, palpitations, orthopnea, claudication, leg swelling and PND.  GIT: Constipation-chronic Neurological: Negative for weakness.   Filed Vitals:   05/31/12 1002  BP: 102/74  Pulse: 78  Height: 5\' 6"  (1.676 m)  Weight: 171 lb (77.565 kg)   Physical Exam  Vitals reviewed.  Constitutional: She appears well-developed and well-nourished. No distress.  Skin: She is not diaphoretic.   PAST PSYCHIATRIC HISTORY: Reviewed  Patient has had a trial of the following psychotropic medications:  Lithium-felt sick; Abilify at 15 mg-didn't work. Risperidone-inadequate trial but patient prefers seroquel, Has not tried Ziprasidone  Patient is currently on the following psychotropic medications:  Quetiapine, Bupropion, Trazodone.   HISTORY OF SUICIDAL ACTS AND SELF-HARM: Reviewed  None.   HISTORY OF VIOLENCE/ASSAULTING OTHERS/LEGAL PROBLEMS: Reviewed  No current legal issues.   SUBSTANCE USE HISTORY:  Caffeine: Patient denies current use.  Nicotine: Patient denies.  Alcohol: Patient denies.  Illicit Drugs: Patient denies.   MENTAL ILLNESS AND SUBSTANCE ABUSE IN FAMILY MEMBERS:  Reviewed   MILITARY HISTORY: Reviewed  None   MEDICAL INFORMATION Reviewed   Allergies: Reviewed  Allergies  Allergen Reactions  . Amoxicillin   . Butazolidin (Phenylbutazone) Other (See Comments)    Oral ulcers  . Morphine And Related Nausea And Vomiting  . Nsaids Other (See Comments)    Oral ulcers  . Pseudoephedrine Swelling    Tongue swells      Current Medications: Reviewed  Current Outpatient Prescriptions on File Prior to Visit  Medication Sig Dispense Refill  . azelastine (ASTELIN) 137 MCG/SPRAY nasal spray       . baclofen (LIORESAL) 10 MG tablet       . calcium carbonate (OS-CAL) 600 MG TABS Take 600 mg  by mouth 2 (two) times daily with a  meal.      . cyanocobalamin 100 MCG tablet Take 100 mcg by mouth daily.      Marland Kitchen dicyclomine (BENTYL) 10 MG capsule Take 10 mg by mouth as needed.      . doxycycline (VIBRAMYCIN) 100 MG capsule       . esomeprazole (NEXIUM) 40 MG capsule Take 40 mg by mouth daily before breakfast.      . lovastatin (MEVACOR) 20 MG tablet Take 20 mg by mouth once.      Marland Kitchen QUEtiapine Fumarate (SEROQUEL XR) 150 MG 24 hr tablet Take 1 tablet (150 mg total) by mouth 2 (two) times daily. Take at 7 AM and 2 PM.  60 tablet  12  . buPROPion (WELLBUTRIN XL) 150 MG 24 hr tablet Take 1 tablet (150 mg total) by mouth every morning.  30 tablet  0  . traZODone (DESYREL) 150 MG tablet Take 1 tablet (150 mg total) by mouth at bedtime.  30 tablet  2  . [DISCONTINUED] methylphenidate (RITALIN) 10 MG tablet         Lab Results: No results found for this or any previous visit (from the past 48 hour(s)).   MENTAL STATUS EXAM:  Objective: Appearance: Well Groomed   Patent attorney:: Good   Speech: Clear and Coherent   Volume: Normal   Mood: "excellent"  9-10/10  Affect: Appropriate   Thought Process: Coherent, Intact, Linear and Logical   Orientation: Full   Thought Content: WDL   Suicidal Thoughts: No   Homicidal Thoughts: No   Memory: Immediate; Good  Recent; Good  Remote; Good   Judgement: Good   Insight: Tanner   Psychomotor Activity: Normal   Concentration: Good   Recall: Good   Akathisia: No   Handed: Left   Concentration: Tanner as evidence by ability to repeat 4 words   AIMS (if indicated): As noted   Assets: Communication Skills  Desire for Improvement  Housing  Resilience  Social Support   Memory: Immediate: 3/3 recent: 3/3   ASSESSMENT:   Diagnosis:  Axis I: Bipolar I Disorder, Mixed, moderate  Axis II: No diagnosis  Axis III:  Constipation   Axis IV: None  Axis V: GAF: 68  PLAN:  1. Affirm with the patient that the medications are taken as ordered. Patient expressed understanding of how their  medications were to be used.  2. Continue the following psychiatric medications as written prior to this appointment with the following changes:  A) Continue Wellbutrin XL 300 mg one in the morning.  B) Contonue Seroquel XR 150 mg BID at 10 AM and 3 PM-patient preferred this medication. Called in a [phone], RX: [id number]. She has received her prescription supply today. C) Trazodone 300 mg. One tablet at bedtime. 3. Therapy: brief supportive therapy provided. Discussed psychosocial stressors.  4. Risks and benefits, side effects and alternatives discussed with patient,she was given an opportunity to ask questions about her medication, illness, and treatment. All current psychiatric medications have been reviewed and discussed with the patient and adjusted as clinically appropriate. The patient has been provided an accurate and updated list of the medications being now prescribed.  5. Patient told to call clinic if any problems occur. Patient advised to go to ER if she should develop SI/HI, side effects, or if symptoms worsen. Has crisis numbers to call if needed.  6. No labs warranted at this time.  7. The patient was encouraged to keep all  PCP and specialty clinic appointments.  8. Patient was instructed to return to clinic in 3 months.  9. The patient expressed understanding of the plan outlined above and agrees with the plan.   Jacqulyn Cane, MD

## 2012-06-17 ENCOUNTER — Telehealth (HOSPITAL_COMMUNITY): Payer: Self-pay
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
## 2012-06-18 ENCOUNTER — Ambulatory Visit (INDEPENDENT_AMBULATORY_CARE_PROVIDER_SITE_OTHER): Payer: No Typology Code available for payment source | Admitting: Psychiatry

## 2012-06-18 ENCOUNTER — Encounter (HOSPITAL_COMMUNITY): Payer: Self-pay | Admitting: Psychiatry

## 2012-06-18 VITALS — BP 116/72 | HR 66 | Ht 66.0 in | Wt 171.0 lb

## 2012-06-18 DIAGNOSIS — F3162 Bipolar disorder, current episode mixed, moderate: Secondary | ICD-10-CM

## 2012-06-18 MED ORDER — QUETIAPINE FUMARATE 50 MG PO TABS: 50.0000 mg | ORAL_TABLET | Freq: Two times a day (BID) | ORAL | Status: DC

## 2012-06-18 MED ORDER — QUETIAPINE FUMARATE 50 MG PO TABS: ORAL_TABLET | ORAL | Status: DC

## 2012-06-18 NOTE — Telephone Encounter (Signed)
Will see patient today
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
## 2012-07-26 ENCOUNTER — Telehealth (HOSPITAL_COMMUNITY): Payer: Self-pay

## 2012-07-26 NOTE — Telephone Encounter (Signed)
Acknowledged.

## 2012-08-15 ENCOUNTER — Telehealth (HOSPITAL_COMMUNITY): Payer: Self-pay | Admitting: Psychiatry

## 2012-08-15 NOTE — Telephone Encounter (Signed)
Message copied by Larena Sox on Thu Aug 15, 2012  9:52 AM ------      Message from: Larena Sox      Created: Fri Jul 26, 2012  6:32 PM       PT's seroquel was mailed to Dr. Darrol Jump office. PT would like you to fax the prescription to [facility] (mail order pharmacy) telling them that it needs to come to our office.  ------

## 2012-08-28 ENCOUNTER — Ambulatory Visit (INDEPENDENT_AMBULATORY_CARE_PROVIDER_SITE_OTHER): Payer: No Typology Code available for payment source | Admitting: Psychiatry

## 2012-08-28 ENCOUNTER — Encounter (HOSPITAL_COMMUNITY): Payer: Self-pay | Admitting: Psychiatry

## 2012-08-28 VITALS — BP 116/67 | HR 71 | Ht 66.0 in | Wt 171.0 lb

## 2012-08-28 DIAGNOSIS — F3162 Bipolar disorder, current episode mixed, moderate: Secondary | ICD-10-CM

## 2012-08-28 DIAGNOSIS — F3176 Bipolar disorder, in full remission, most recent episode depressed: Secondary | ICD-10-CM

## 2012-08-28 MED ORDER — BUPROPION HCL ER (XL) 300 MG PO TB24: 300.0000 mg | ORAL_TABLET | ORAL | Status: DC

## 2012-08-28 MED ORDER — QUETIAPINE FUMARATE 50 MG PO TABS: ORAL_TABLET | ORAL | Status: DC

## 2012-08-28 MED ORDER — TRAZODONE HCL 300 MG PO TABS: 300.0000 mg | ORAL_TABLET | Freq: Every day | ORAL | Status: DC

## 2012-08-28 NOTE — Progress Notes (Signed)
[facility] Follow-up Outpatient Visit   [facility] Follow-up Outpatient Visit  Anna Tanner [birth year]  Date:  08/28/2012  HPI Comments:  Anna Tanner is a 62 y/o female with a past psychiatric history significant for Bipolar I Disorder. The patient has been referred to psychiatry for medication management. She was prescribed immediate release quetiapine for agitation and anxiety. She reports she has only had to use the immediate release twice and has only used half a tablet. The patient reports she is doing well. She reports she is taking her medications and denies any side effects.  Patient denies current suicidal ideation, intent, or plan. Patient denies current homicidal ideation, intent, or plan. Patient denies auditory hallucinations. Patient denies visual hallucinations. Patient denies symptoms of paranoia. Patient states sleep is improved, with approximately 9-10 hours of sleep per night. Appetite is fair. She continues to report that her energy level fair but not as high as she would like it. Patient denies feeling of anhedonia. Patient denies hopelessness, helplessness, or guilt.   Denies any recent episodes consistent with mania, particularly decreased need for sleep with increased energy, grandiosity, impulsivity, hyperverbal and pressured speech, or increased productivity, but she and her husband reports continued irritability. Denies any recent symptoms consistent with psychosis, particularly auditory or visual hallucinations, thought broadcasting/insertion/withdrawal, or ideas of reference. Also denies excessive worry to the point of physical symptoms as well as any panic attacks. Denies any history of trauma or symptoms consistent with PTSD such as flashbacks, nightmares, hypervigilance, feelings of numbness or inability to connect with others.  .  Review of Systems  Constitutional: Negative for fever, chills and weight loss.  Respiratory: Negative  for cough, hemoptysis, sputum production and shortness of breath.   Cardiovascular: Negative for chest pain, palpitations and leg swelling.  Gastrointestinal: Negative for heartburn, nausea, vomiting, abdominal pain, diarrhea and constipation.  Musculoskeletal:       No muscle weakness or gait problems.  Neurological: Negative for dizziness, tingling, sensory change, speech change, focal weakness and weakness.   Filed Vitals:   08/28/12 0959  BP: 116/67  Pulse: 71  Height: 5\' 6"  (1.676 m)  Weight: 171 lb (77.565 kg)    Physical Exam  Vitals reviewed.  Constitutional: She appears well-developed and well-nourished. No distress.  Skin: She is not diaphoretic.   PAST PSYCHIATRIC HISTORY: Reviewed  Patient has had a trial of the following psychotropic medications:  Lithium-felt sick; Abilify at 15 mg-didn't work. Risperidone-inadequate trial but patient prefers seroquel, Has not tried Ziprasidone  Patient is currently on the following psychotropic medications:  Quetiapine, Bupropion, Trazodone.   HISTORY OF SUICIDAL ACTS AND SELF-HARM: Reviewed  None.   HISTORY OF VIOLENCE/ASSAULTING OTHERS/LEGAL PROBLEMS: Reviewed  No current legal issues.   SUBSTANCE USE HISTORY:  Caffeine: 1 cup coffee in AM Nicotine: Patient denies.  Alcohol: Patient denies.  Illicit Drugs: Patient denies.   MENTAL ILLNESS AND SUBSTANCE ABUSE IN FAMILY MEMBERS:  Reviewed   MILITARY HISTORY: Reviewed  None   MEDICAL INFORMATION Reviewed   Allergies: Reviewed  Allergies  Allergen Reactions  . Amoxicillin   . Butazolidin (Phenylbutazone) Other (See Comments)    Oral ulcers  . Morphine And Related Nausea And Vomiting  . Nsaids Other (See Comments)    Oral ulcers  . Pseudoephedrine Swelling    Tongue swells      Current Medications: Reviewed  Current Outpatient Prescriptions on File Prior to Visit  Medication Sig Dispense Refill  . azelastine (ASTELIN) 137 MCG/SPRAY nasal  spray       .  baclofen (LIORESAL) 10 MG tablet       . buPROPion (WELLBUTRIN XL) 150 MG 24 hr tablet Take 1 tablet (150 mg total) by mouth every morning.  30 tablet  0  . buPROPion (WELLBUTRIN XL) 300 MG 24 hr tablet Take 1 tablet (300 mg total) by mouth every morning.  90 tablet  1  . calcium carbonate (OS-CAL) 600 MG TABS Take 600 mg by mouth 2 (two) times daily with a meal.      . cyanocobalamin 100 MCG tablet Take 100 mcg by mouth daily.      Marland Kitchen dicyclomine (BENTYL) 10 MG capsule Take 10 mg by mouth as needed.      . doxycycline (VIBRAMYCIN) 100 MG capsule       . esomeprazole (NEXIUM) 40 MG capsule Take 40 mg by mouth daily before breakfast.      . LINZESS 145 MCG CAPS Take 145 mcg by mouth Daily.      Marland Kitchen lovastatin (MEVACOR) 20 MG tablet Take 20 mg by mouth once.      Marland Kitchen PROAIR HFA 108 (90 BASE) MCG/ACT inhaler Inhale 2 puffs into the lungs as needed.      Marland Kitchen QUEtiapine (SEROQUEL) 50 MG tablet Take one half tablet to one whole tablet twice a day.  60 tablet  0  . QUEtiapine Fumarate (SEROQUEL XR) 150 MG 24 hr tablet Take 1 tablet (150 mg total) by mouth 2 (two) times daily. Take at 7 AM and 2 PM.  60 tablet  12  . traZODone (DESYREL) 150 MG tablet Take 1 tablet (150 mg total) by mouth at bedtime.  30 tablet  2  . traZODone (DESYREL) 300 MG tablet Take 1 tablet (300 mg total) by mouth at bedtime.  90 tablet  1  . [DISCONTINUED] methylphenidate (RITALIN) 10 MG tablet        No current facility-administered medications on file prior to visit.    Lab Results: No results found for this or any previous visit (from the past 48 hour(s)).   MENTAL STATUS EXAM:  Objective: Appearance: Well Groomed   Patent attorney:: Good   Speech: Clear and Coherent   Volume: Normal   Mood: "fantanstic, not manic"  10/10  (0=Very depressed; 5=Neutral; 10=Very Happy)   Affect: Appropriate   Thought Process: Coherent, Intact, Linear and Logical   Orientation: Full   Thought Content: WDL   Suicidal Thoughts: No   Homicidal  Thoughts: No   Memory: Immediate; Good  Recent; Good  Remote; Good   Judgement: Good   Insight: Fair   Psychomotor Activity: Normal   Concentration: Good   Recall: Good   Akathisia: No   Handed: Left   AIMS (if indicated): As noted   Assets: Communication Skills  Desire for Improvement  Housing  Resilience  Social Support   Memory: Immediate: 3/3 recent: 3/3   ASSESSMENT:   Diagnosis:  Axis I: Bipolar I Disorder, Mixed, moderate  Axis II: No diagnosis  Axis III:  Constipation   Axis IV: None  Axis V: GAF: 68  PLAN:  1. Affirm with the patient that the medications are taken as ordered. Patient expressed understanding of how their medications were to be used.  2. Continue the following psychiatric medications as written prior to this appointment with the following changes:  A) Continue Wellbutrin XL 300 mg one in the morning.  B) Contonue Seroquel XR 150 mg BID at 10 AM and 3  PM-patient preferred this medication. Called in a [phone], RX: [id number]. She has received her prescription supply today. C) Will continue Immediate release seroquel 50 mg-one half to one whole tablet daily for agitation and anxiety. The patient has asked about addiing a benzodiazepine, but her symptoms are more likely related to her  D) Trazodone 300 mg. One tablet at bedtime. 3. Therapy: brief supportive therapy provided. Discussed psychosocial stressors.  4. Risks and benefits, side effects and alternatives discussed with patient,she was given an opportunity to ask questions about her medication, illness, and treatment. All current psychiatric medications have been reviewed and discussed with the patient and adjusted as clinically appropriate. The patient has been provided an accurate and updated list of the medications being now prescribed.  5. Patient told to call clinic if any problems occur. Patient advised to go to ER if she should develop SI/HI, side effects, or if symptoms worsen. Has crisis  numbers to call if needed.  6. No labs warranted at this time.  7. The patient was encouraged to keep all PCP and specialty clinic appointments.  8. Patient was instructed to return to clinic in 4 months.  9. The patient expressed understanding of the plan outlined above and agrees with the plan.   Jacqulyn Cane, M.D.  08/28/2012 9:58 AM

## 2012-08-29 NOTE — Telephone Encounter (Signed)
Spoke to patient. seroquel will be sent to Dr. Laneta Simmers office.

## 2012-08-30 ENCOUNTER — Encounter (HOSPITAL_COMMUNITY): Payer: Self-pay | Admitting: Psychiatry

## 2012-12-31 ENCOUNTER — Ambulatory Visit (INDEPENDENT_AMBULATORY_CARE_PROVIDER_SITE_OTHER): Payer: Medicare Other | Admitting: Psychiatry

## 2012-12-31 ENCOUNTER — Encounter (HOSPITAL_COMMUNITY): Payer: Self-pay | Admitting: Psychiatry

## 2012-12-31 VITALS — BP 106/62 | HR 64 | Ht 66.0 in | Wt 167.0 lb

## 2012-12-31 DIAGNOSIS — F3176 Bipolar disorder, in full remission, most recent episode depressed: Secondary | ICD-10-CM

## 2012-12-31 DIAGNOSIS — F3162 Bipolar disorder, current episode mixed, moderate: Secondary | ICD-10-CM

## 2012-12-31 MED ORDER — BUPROPION HCL ER (XL) 300 MG PO TB24: 300.0000 mg | ORAL_TABLET | ORAL | Status: DC

## 2012-12-31 NOTE — Progress Notes (Signed)
[facility] Follow-up Outpatient Visit    Anna Tanner [birth year]  Date:  12/31/2012  HPI Comments:  Anna Tanner is a 62 y/o female with a past psychiatric history significant for Bipolar I Disorder. The patient has been referred to psychiatry for medication management.   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  She reports enjoying taking care of her elderly neighbor as it helps give her a sense of purpose and charity. She was prescribed immediate release quetiapine for agitation and anxiety and reports having to use it only once or twice for agitation. She reports she has only had to use the immediate release twice and has only used half a tablet and had to use it only twice. The patient reports she is doing well. She reports she is taking her medications and denies any side effects.  Patient denies current suicidal ideation, intent, or plan. Patient denies current homicidal ideation, intent, or plan. Patient denies auditory hallucinations. Patient denies visual hallucinations. Patient denies symptoms of paranoia. Patient states sleep is improved, with approximately 8 hours of sleep per night. Appetite is fair. She continues to report that her energy level fair but not as high as she would like it. Patient denies feeling of anhedonia. Patient denies hopelessness, helplessness, or guilt.   Denies any recent episodes consistent with mania, particularly decreased need for sleep with increased energy, grandiosity, impulsivity, hyperverbal and pressured speech, or increased productivity, but she and her husband reports continued irritability. Denies any recent symptoms consistent with psychosis, particularly auditory or visual hallucinations, thought broadcasting/insertion/withdrawal, or ideas of reference. Also denies excessive worry to the point of physical symptoms as well as any panic attacks. Denies any history of  trauma or symptoms consistent with PTSD such as flashbacks, nightmares, hypervigilance, feelings of numbness or inability to connect with others.  .  Review of Systems  Constitutional: Negative for fever, chills and weight loss.  Respiratory: Negative for cough, hemoptysis, sputum production and shortness of breath.   Cardiovascular: Negative for chest pain, palpitations and leg swelling.  Gastrointestinal: Negative for heartburn, nausea, vomiting, abdominal pain, diarrhea and constipation.  Musculoskeletal:       No muscle weakness or gait problems.  Neurological: Negative for dizziness, tingling, sensory change, speech change, focal weakness and weakness.   Filed Vitals:   12/31/12 1028  BP: 106/62  Pulse: 64  Height: 5\' 6"  (1.676 m)  Weight: 167 lb (75.751 kg)    Physical Exam  Vitals reviewed.  Constitutional: She appears well-developed and well-nourished. No distress.  Skin: She is not diaphoretic.  Musculoskeletal: Strength & Muscle Tone: within normal limits Gait & Station: normal Patient leans: N/A   PAST PSYCHIATRIC HISTORY: Reviewed  Patient has had a trial of the following psychotropic medications:  Lithium-felt sick; Abilify at 15 mg-didn't work. Risperidone-inadequate trial but patient prefers seroquel, Has not tried Ziprasidone  Patient is currently on the following psychotropic medications:  Quetiapine, Bupropion, Trazodone.   HISTORY OF SUICIDAL ACTS AND SELF-HARM: Reviewed  None.   HISTORY OF VIOLENCE/ASSAULTING OTHERS/LEGAL PROBLEMS: Reviewed  No current legal issues.   SUBSTANCE USE HISTORY:  Caffeine: 1 cup coffee in AM in the winter Nicotine: Patient denies.  Alcohol: Patient denies.  Illicit Drugs: Patient denies.   MENTAL ILLNESS AND SUBSTANCE ABUSE IN FAMILY MEMBERS:  Reviewed   MILITARY HISTORY: Reviewed  None   MEDICAL INFORMATION Reviewed   Allergies: Reviewed  Allergies  Allergen  Reactions  . Amoxicillin   . Butazolidin  [Phenylbutazone] Other (See Comments)    Oral ulcers  . Morphine And Related Nausea And Vomiting  . Nsaids Other (See Comments)    Oral ulcers  . Pseudoephedrine Swelling    Tongue swells      Current Medications: Reviewed  Current Outpatient Prescriptions on File Prior to Visit  Medication Sig Dispense Refill  . azelastine (ASTELIN) 137 MCG/SPRAY nasal spray       . baclofen (LIORESAL) 10 MG tablet       . buPROPion (WELLBUTRIN XL) 300 MG 24 hr tablet Take 1 tablet (300 mg total) by mouth every morning.  90 tablet  1  . calcium carbonate (OS-CAL) 600 MG TABS Take 600 mg by mouth 2 (two) times daily with a meal.      . cyanocobalamin 100 MCG tablet Take 100 mcg by mouth daily.      Marland Kitchen esomeprazole (NEXIUM) 40 MG capsule Take 40 mg by mouth daily before breakfast.      . ipratropium (ATROVENT) 0.06 % nasal spray Place 1 spray into the nose as needed.      . lovastatin (MEVACOR) 20 MG tablet Take 20 mg by mouth once.      Marland Kitchen PROAIR HFA 108 (90 BASE) MCG/ACT inhaler Inhale 2 puffs into the lungs as needed.      Marland Kitchen QUEtiapine (SEROQUEL) 50 MG tablet Take one half tablet to one whole tablet twice a day. As needed for agitation.  30 tablet  1  . QUEtiapine Fumarate (SEROQUEL XR) 150 MG 24 hr tablet Take 1 tablet (150 mg total) by mouth 2 (two) times daily. Take at 7 AM and 2 PM.  60 tablet  12  . trazodone (DESYREL) 300 MG tablet Take 1 tablet (300 mg total) by mouth at bedtime.  90 tablet  1  . [DISCONTINUED] methylphenidate (RITALIN) 10 MG tablet        No current facility-administered medications on file prior to visit.    Lab Results: No results found for this or any previous visit (from the past 48 hour(s)).    Psychiatric Specialty Exam: Objective: Appearance: Well Groomed   Eye Contact:: Good   Speech: Clear and Coherent   Volume: Normal   Mood: "great"  10/10  (0=Very depressed; 5=Neutral; 10=Very Happy)   Affect: Appropriate   Thought Process: Coherent, Intact, Linear and  Logical   Orientation: Full   Thought Content: WDL   Suicidal Thoughts: No   Homicidal Thoughts: No   Memory: Immediate; Good  Recent; Good  Remote; Good   Judgement: Good   Insight: Fair   Psychomotor Activity: Normal   Concentration: Good   Recall: Good   Akathisia: No   Handed: Left   AIMS (if indicated): As noted   Assets: Communication Skills  Desire for Improvement  Housing  Resilience  Social Support   Memory: Immediate: 3/3 recent: 3/3   ASSESSMENT:   Diagnosis:  Axis I: Bipolar I Disorder, Mixed, moderate   Plan of Care:  PLAN:  1. Affirm with the patient that the medications are taken as ordered. Patient  expressed understanding of how their medications were to be used.    Laboratory:  No labs warranted at this time. PCP follows HbA1c, and FLP.   Psychotherapy: Therapy: brief supportive therapy provided.  Discussed psychosocial stressors in detail.   Medications:  Start the following psychiatric medications as written prior to this appointment:  a) Continue Wellbutrin XL 300 mg  one in the morning.  b) Contonue Seroquel XR 150 mg BID at 10 AM and 3 PM-patient preferred this medication. Medications are called in at [phone], RX: [id number]. She has received her prescription supply. This is renewed yearly, at the end of the year  c) Will continue Immediate release seroquel 50 mg-one half to one whole tablet daily for agitation and anxiety. The patient has asked about addiing a benzodiazepine, but her symptoms are more likely related to her  d) Trazodone 300 mg. One tablet at bedtime.  -Risks and benefits, side effects and alternatives discussed with patient, she was given an opportunity to ask questions about her medication, illness, and treatment. All current psychiatric medications have been reviewed and discussed with the patient and adjusted as clinically appropriate. The patient has been provided an accurate and updated list of the medications being now  prescribed.   Routine PRN Medications:  Negative  Consultations: The patient was encouraged to keep all PCP and specialty clinic appointments.   Safety Concerns:   Patient told to call clinic if any problems occur. Patient advised to go to  ER  if she should develop SI/HI, side effects, or if symptoms worsen. Has crisis numbers to call if needed.    Other:   8. Patient was instructed to return to clinic in 4 months.  9. The patient was advised to call and cancel their mental health appointment within 24 hours of the appointment, if they are unable to keep the appointment, as well as the three no show and termination from clinic policy. 10. The patient expressed understanding of the plan and agrees with the above.    Jacqulyn Cane, M.D.  12/31/2012 10:27 AM

## 2013-01-08 ENCOUNTER — Telehealth (HOSPITAL_COMMUNITY): Payer: Self-pay

## 2013-01-08 DIAGNOSIS — F3176 Bipolar disorder, in full remission, most recent episode depressed: Secondary | ICD-10-CM

## 2013-01-08 MED ORDER — BUPROPION HCL ER (XL) 300 MG PO TB24: 300.0000 mg | ORAL_TABLET | ORAL | Status: DC

## 2013-01-08 NOTE — Telephone Encounter (Signed)
Sent in Refill

## 2013-03-26 ENCOUNTER — Telehealth (HOSPITAL_COMMUNITY): Payer: Self-pay

## 2013-03-26 DIAGNOSIS — F3176 Bipolar disorder, in full remission, most recent episode depressed: Secondary | ICD-10-CM

## 2013-03-26 MED ORDER — TRAZODONE HCL 300 MG PO TABS: 300.0000 mg | ORAL_TABLET | Freq: Every day | ORAL | Status: DC

## 2013-03-26 NOTE — Telephone Encounter (Signed)
Refilled trazodone.

## 2013-03-26 NOTE — Telephone Encounter (Signed)
Pt states pharmacy has sent over request for trazadone several times with no success. Explained to patient we have not received any request but would ask for you to send in refill to [facility].

## 2013-05-05 ENCOUNTER — Encounter (INDEPENDENT_AMBULATORY_CARE_PROVIDER_SITE_OTHER): Payer: Self-pay

## 2013-05-05 ENCOUNTER — Ambulatory Visit (INDEPENDENT_AMBULATORY_CARE_PROVIDER_SITE_OTHER): Payer: No Typology Code available for payment source | Admitting: Psychiatry

## 2013-05-05 ENCOUNTER — Encounter (HOSPITAL_COMMUNITY): Payer: Self-pay | Admitting: Psychiatry

## 2013-05-05 VITALS — BP 96/59 | HR 73 | Wt 168.0 lb

## 2013-05-05 DIAGNOSIS — F3162 Bipolar disorder, current episode mixed, moderate: Secondary | ICD-10-CM

## 2013-05-05 DIAGNOSIS — F3176 Bipolar disorder, in full remission, most recent episode depressed: Secondary | ICD-10-CM

## 2013-05-05 MED ORDER — QUETIAPINE FUMARATE 50 MG PO TABS: ORAL_TABLET | ORAL | Status: DC

## 2013-05-05 MED ORDER — QUETIAPINE FUMARATE ER 150 MG PO TB24: 150.0000 mg | ORAL_TABLET | Freq: Two times a day (BID) | ORAL | Status: DC

## 2013-05-05 MED ORDER — TRAZODONE HCL 300 MG PO TABS: 300.0000 mg | ORAL_TABLET | Freq: Every day | ORAL | Status: DC

## 2013-05-05 MED ORDER — BUPROPION HCL ER (XL) 300 MG PO TB24: 300.0000 mg | ORAL_TABLET | ORAL | Status: DC
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
## 2013-05-14 ENCOUNTER — Telehealth (HOSPITAL_COMMUNITY): Payer: Self-pay

## 2013-05-14 DIAGNOSIS — F3176 Bipolar disorder, in full remission, most recent episode depressed: Secondary | ICD-10-CM

## 2013-05-15 MED ORDER — QUETIAPINE FUMARATE ER 150 MG PO TB24: 150.0000 mg | ORAL_TABLET | Freq: Two times a day (BID) | ORAL | Status: DC

## 2013-05-15 NOTE — Telephone Encounter (Signed)
Refill request

## 2013-05-28 DIAGNOSIS — Z8371 Family history of colonic polyps: Secondary | ICD-10-CM | POA: Insufficient documentation

## 2013-05-28 DIAGNOSIS — Z83719 Family history of colon polyps, unspecified: Secondary | ICD-10-CM | POA: Insufficient documentation

## 2013-05-28 DIAGNOSIS — K5900 Constipation, unspecified: Secondary | ICD-10-CM | POA: Insufficient documentation

## 2013-05-28 DIAGNOSIS — K589 Irritable bowel syndrome without diarrhea: Secondary | ICD-10-CM | POA: Insufficient documentation

## 2013-06-04 ENCOUNTER — Telehealth (HOSPITAL_COMMUNITY): Payer: Self-pay | Admitting: Psychiatry

## 2013-06-04 NOTE — Telephone Encounter (Signed)
Provided samples of seroquel xr 150 mg, 60 tablet, mailed in from drug company.

## 2013-06-12 ENCOUNTER — Telehealth (HOSPITAL_COMMUNITY): Payer: Self-pay

## 2013-06-12 DIAGNOSIS — F3160 Bipolar disorder, current episode mixed, unspecified: Secondary | ICD-10-CM

## 2013-06-13 NOTE — Telephone Encounter (Addendum)
Will order lab work for patient. Have called patient and left message stating her labs slip will be at the office.

## 2013-06-16 ENCOUNTER — Telehealth (HOSPITAL_COMMUNITY): Payer: Self-pay | Admitting: Psychiatry

## 2013-06-16 DIAGNOSIS — F3160 Bipolar disorder, current episode mixed, unspecified: Secondary | ICD-10-CM

## 2013-06-16 NOTE — Telephone Encounter (Signed)
ordering labs.

## 2013-06-18 LAB — LIPID PANEL
CHOL/HDL RATIO: 2.4 ratio
Cholesterol: 158 mg/dL (ref 0–200)
HDL: 67 mg/dL (ref >39)
LDL Cholesterol: 71 mg/dL (ref 0–99)
Triglycerides: 102 mg/dL (ref <150)
VLDL: 20 mg/dL (ref 0–40)

## 2013-06-18 LAB — HEMOGLOBIN A1C
Hgb A1c MFr Bld: 5.7 % — ABNORMAL HIGH (ref <5.7)
Mean Plasma Glucose: 117 mg/dL — ABNORMAL HIGH (ref <117)

## 2013-06-25 ENCOUNTER — Telehealth (HOSPITAL_COMMUNITY): Payer: Self-pay

## 2013-06-27 NOTE — Telephone Encounter (Signed)
Called patient. Informed her of the results.

## 2013-07-24 ENCOUNTER — Telehealth (HOSPITAL_COMMUNITY): Payer: Self-pay | Admitting: Psychiatry

## 2013-07-24 DIAGNOSIS — F3162 Bipolar disorder, current episode mixed, moderate: Secondary | ICD-10-CM

## 2013-07-24 MED ORDER — QUETIAPINE FUMARATE 50 MG PO TABS: ORAL_TABLET | ORAL | Status: DC

## 2013-07-24 NOTE — Telephone Encounter (Signed)
Provided letter for medication assistance.

## 2013-07-28 ENCOUNTER — Telehealth (HOSPITAL_COMMUNITY): Payer: Self-pay | Admitting: Psychiatry

## 2013-08-13 NOTE — Telephone Encounter (Signed)
Erroneous encounter

## 2013-09-02 ENCOUNTER — Ambulatory Visit (HOSPITAL_COMMUNITY): Payer: Self-pay | Admitting: Psychiatry

## 2013-09-19 ENCOUNTER — Ambulatory Visit (HOSPITAL_COMMUNITY): Payer: Self-pay | Admitting: Psychiatry

## 2013-10-07 ENCOUNTER — Encounter (INDEPENDENT_AMBULATORY_CARE_PROVIDER_SITE_OTHER): Payer: Self-pay

## 2013-10-07 ENCOUNTER — Ambulatory Visit (INDEPENDENT_AMBULATORY_CARE_PROVIDER_SITE_OTHER): Payer: No Typology Code available for payment source | Admitting: Physician Assistant

## 2013-10-07 ENCOUNTER — Encounter (HOSPITAL_COMMUNITY): Payer: Self-pay | Admitting: Physician Assistant

## 2013-10-07 VITALS — BP 91/55 | HR 68 | Ht 64.0 in | Wt 161.0 lb

## 2013-10-07 DIAGNOSIS — F3160 Bipolar disorder, current episode mixed, unspecified: Secondary | ICD-10-CM

## 2013-10-07 DIAGNOSIS — F3176 Bipolar disorder, in full remission, most recent episode depressed: Secondary | ICD-10-CM

## 2013-10-07 DIAGNOSIS — F3162 Bipolar disorder, current episode mixed, moderate: Secondary | ICD-10-CM

## 2013-10-07 MED ORDER — QUETIAPINE FUMARATE 50 MG PO TABS: ORAL_TABLET | ORAL | Status: DC

## 2013-10-07 MED ORDER — BUPROPION HCL ER (XL) 300 MG PO TB24: 300.0000 mg | ORAL_TABLET | ORAL | Status: DC

## 2013-10-07 MED ORDER — QUETIAPINE FUMARATE ER 150 MG PO TB24: 150.0000 mg | ORAL_TABLET | Freq: Two times a day (BID) | ORAL | Status: DC

## 2013-10-07 NOTE — Addendum Note (Signed)
Addended by: Nena Polio T on: 10/07/2013 03:56 PM   Modules accepted: Orders

## 2013-10-07 NOTE — Progress Notes (Signed)
   [facility] Follow-up Outpatient Visit  Anna Tanner 07/11/1950  Date: 10/07/2013 Dx:  Bipolar disorder MRE mixed   Subjective: Met with [name] today for a medication refill. She tells me she will be following to Dr. Putheval's office when he opens his practice. She would like refills of her medication until she can be seen there. She reports that she is doing great and has no new problems. She is very complimentary of both Dr. Putheval as well as Dr. Bogard. She is looking forward to seeing Dr. Putheval.  Filed Vitals:   10/07/13 1526  BP: 91/55  Pulse: 68    Mental Status Examination  Appearance: Neat and clean, nicely groomed. Alert: Yes Attention: good  Cooperative: Yes Eye Contact: Good Speech: Clear and goal directed Psychomotor Activity: Normal Memory/Concentration: good Oriented: person, place and time/date Mood: Euthymic Affect: Congruent Thought Processes and Associations: Goal Directed Fund of Knowledge: Good Thought Content: denies  Insight: Good Judgement: Good  Diagnosis: MDD MRE mixed currently stable.  Treatment Plan:  1. Will refill this pleasant lady's prescriptions until she can see Dr. P. We will be happy to see her if she changes her mind and would like to stay.   T.  RPAC 3:46 PM 10/07/2013  

## 2013-10-07 NOTE — Addendum Note (Signed)
Addended by: Nena Polio T on: 10/07/2013 04:00 PM   Modules accepted: Orders

## 2013-10-15 ENCOUNTER — Telehealth (HOSPITAL_COMMUNITY): Payer: Self-pay

## 2013-10-15 ENCOUNTER — Other Ambulatory Visit (HOSPITAL_COMMUNITY): Payer: Self-pay | Admitting: Physician Assistant

## 2013-10-15 DIAGNOSIS — F3162 Bipolar disorder, current episode mixed, moderate: Secondary | ICD-10-CM

## 2013-10-15 DIAGNOSIS — F3176 Bipolar disorder, in full remission, most recent episode depressed: Secondary | ICD-10-CM

## 2013-10-15 MED ORDER — QUETIAPINE FUMARATE 300 MG PO TABS: ORAL_TABLET | ORAL | Status: DC

## 2013-10-16 ENCOUNTER — Other Ambulatory Visit (HOSPITAL_COMMUNITY): Payer: Self-pay | Admitting: Physician Assistant

## 2013-10-16 DIAGNOSIS — F3162 Bipolar disorder, current episode mixed, moderate: Secondary | ICD-10-CM

## 2013-10-16 MED ORDER — QUETIAPINE FUMARATE 50 MG PO TABS: ORAL_TABLET | ORAL | Status: DC

## 2013-10-16 MED ORDER — QUETIAPINE FUMARATE 300 MG PO TABS
300.0000 mg | ORAL_TABLET | Freq: Two times a day (BID) | ORAL | Status: DC
Start: 2013-10-16 — End: 2022-02-01

## 2013-10-16 NOTE — Telephone Encounter (Signed)
Phoned patient who stated that she needed the 50mg  Seroquel printed and faxed to [facility]. Written for # 90/0 RF.Marland Kitchen This was reprinted, signed and faxed to [facility]. Marlane Hatcher. [place] RPAC. 10/16/2013 9:09 AM

## 2013-10-16 NOTE — Telephone Encounter (Signed)
I spoke with patient, reordered the Seroquel as 300mg  1/2 BID number 90/2 RF and faxed it to [facility]. 10/16/2013 8:57 AM Milta Deiters T. Karma Hiney RPAC

## 2013-10-22 DIAGNOSIS — E7800 Pure hypercholesterolemia, unspecified: Secondary | ICD-10-CM | POA: Insufficient documentation

## 2013-10-23 DIAGNOSIS — M47817 Spondylosis without myelopathy or radiculopathy, lumbosacral region: Secondary | ICD-10-CM | POA: Insufficient documentation

## 2013-10-27 ENCOUNTER — Telehealth (HOSPITAL_COMMUNITY): Payer: Self-pay

## 2013-10-29 NOTE — Telephone Encounter (Signed)
Patient called again very angry and threatening. She was frustrated that her prescriptions were still NOT CORRECT!  She yelled into the phone and was extremely agitated. She said her credit card was charged 40.$ and when was I going to reimburse her for medication she stated that she did not need!!  She had already called my boss and was waiting for him to call back. She wanted to know if [facility] was going to pay for her medication that she did not need!  I explained that I was uncomfortable with her tone and felt very threatened by her statements. As she has already taken the matter above my head there was not much more I was able to do.   I apologized again for the error, and explained that I had never had the experience of witting a prescription for the same drug for two different patient assist programs.  I would be happy to speak with the drug company/Rx program to see if there was anything else that could be done, but I would not be giving her any money. She could take that up with my employer.  I did however contact [facility] and after 30 minutes on hold, I spoke with a supervisor, Elta Guadeloupe who stated that [employer] had spoken with the patient personally on 10/17/2013 to verify the charge and she agreed to the charge!  I will let E. Zorita Pang know this first thing in the morning so that he can return her phone call.  10/29/2013 6:00 PM Milta Deiters T. Kelty Szafran RPAC 6:00 PM 10/29/2013

## 2013-11-24 ENCOUNTER — Other Ambulatory Visit (HOSPITAL_COMMUNITY): Payer: Self-pay | Admitting: Physician Assistant

## 2014-01-23 DIAGNOSIS — F32A Depression, unspecified: Secondary | ICD-10-CM | POA: Insufficient documentation

## 2014-01-23 DIAGNOSIS — M419 Scoliosis, unspecified: Secondary | ICD-10-CM | POA: Insufficient documentation

## 2014-01-23 DIAGNOSIS — G589 Mononeuropathy, unspecified: Secondary | ICD-10-CM | POA: Insufficient documentation

## 2014-02-12 DIAGNOSIS — F3177 Bipolar disorder, in partial remission, most recent episode mixed: Secondary | ICD-10-CM | POA: Insufficient documentation

## 2014-03-03 DIAGNOSIS — M778 Other enthesopathies, not elsewhere classified: Secondary | ICD-10-CM | POA: Insufficient documentation

## 2014-03-03 DIAGNOSIS — M25541 Pain in joints of right hand: Secondary | ICD-10-CM | POA: Insufficient documentation

## 2015-05-06 DIAGNOSIS — G5610 Other lesions of median nerve, unspecified upper limb: Secondary | ICD-10-CM | POA: Diagnosis not present

## 2015-05-06 DIAGNOSIS — G6289 Other specified polyneuropathies: Secondary | ICD-10-CM | POA: Diagnosis not present

## 2015-05-06 DIAGNOSIS — G3184 Mild cognitive impairment, so stated: Secondary | ICD-10-CM | POA: Diagnosis not present

## 2015-05-06 DIAGNOSIS — M5412 Radiculopathy, cervical region: Secondary | ICD-10-CM | POA: Diagnosis not present

## 2015-05-12 DIAGNOSIS — M79671 Pain in right foot: Secondary | ICD-10-CM | POA: Diagnosis not present

## 2015-06-21 DIAGNOSIS — K581 Irritable bowel syndrome with constipation: Secondary | ICD-10-CM | POA: Diagnosis not present

## 2015-06-29 DIAGNOSIS — C44619 Basal cell carcinoma of skin of left upper limb, including shoulder: Secondary | ICD-10-CM | POA: Diagnosis not present

## 2015-06-29 DIAGNOSIS — Z08 Encounter for follow-up examination after completed treatment for malignant neoplasm: Secondary | ICD-10-CM | POA: Diagnosis not present

## 2015-06-29 DIAGNOSIS — L570 Actinic keratosis: Secondary | ICD-10-CM | POA: Diagnosis not present

## 2015-06-29 DIAGNOSIS — C44712 Basal cell carcinoma of skin of right lower limb, including hip: Secondary | ICD-10-CM | POA: Diagnosis not present

## 2015-06-29 DIAGNOSIS — Z85828 Personal history of other malignant neoplasm of skin: Secondary | ICD-10-CM | POA: Diagnosis not present

## 2015-06-30 DIAGNOSIS — G4733 Obstructive sleep apnea (adult) (pediatric): Secondary | ICD-10-CM | POA: Diagnosis not present

## 2015-07-01 DIAGNOSIS — R69 Illness, unspecified: Secondary | ICD-10-CM | POA: Diagnosis not present

## 2015-07-05 DIAGNOSIS — K219 Gastro-esophageal reflux disease without esophagitis: Secondary | ICD-10-CM | POA: Diagnosis not present

## 2015-07-05 DIAGNOSIS — R69 Illness, unspecified: Secondary | ICD-10-CM | POA: Diagnosis not present

## 2015-07-05 DIAGNOSIS — M170 Bilateral primary osteoarthritis of knee: Secondary | ICD-10-CM | POA: Diagnosis not present

## 2015-07-05 DIAGNOSIS — E785 Hyperlipidemia, unspecified: Secondary | ICD-10-CM | POA: Diagnosis not present

## 2015-08-02 DIAGNOSIS — M25521 Pain in right elbow: Secondary | ICD-10-CM | POA: Diagnosis not present

## 2015-08-10 DIAGNOSIS — L570 Actinic keratosis: Secondary | ICD-10-CM | POA: Diagnosis not present

## 2015-08-10 DIAGNOSIS — C44712 Basal cell carcinoma of skin of right lower limb, including hip: Secondary | ICD-10-CM | POA: Diagnosis not present

## 2015-08-24 DIAGNOSIS — M25522 Pain in left elbow: Secondary | ICD-10-CM | POA: Diagnosis not present

## 2015-08-24 DIAGNOSIS — M25521 Pain in right elbow: Secondary | ICD-10-CM | POA: Diagnosis not present

## 2015-09-01 DIAGNOSIS — M25521 Pain in right elbow: Secondary | ICD-10-CM | POA: Diagnosis not present

## 2015-09-01 DIAGNOSIS — R531 Weakness: Secondary | ICD-10-CM | POA: Diagnosis not present

## 2015-09-01 DIAGNOSIS — M25522 Pain in left elbow: Secondary | ICD-10-CM | POA: Diagnosis not present

## 2015-09-09 DIAGNOSIS — M25522 Pain in left elbow: Secondary | ICD-10-CM | POA: Diagnosis not present

## 2015-09-09 DIAGNOSIS — R531 Weakness: Secondary | ICD-10-CM | POA: Diagnosis not present

## 2015-09-09 DIAGNOSIS — M25521 Pain in right elbow: Secondary | ICD-10-CM | POA: Diagnosis not present

## 2015-09-16 DIAGNOSIS — R531 Weakness: Secondary | ICD-10-CM | POA: Diagnosis not present

## 2015-09-16 DIAGNOSIS — M25521 Pain in right elbow: Secondary | ICD-10-CM | POA: Diagnosis not present

## 2015-09-16 DIAGNOSIS — M25522 Pain in left elbow: Secondary | ICD-10-CM | POA: Diagnosis not present

## 2015-09-20 DIAGNOSIS — M25522 Pain in left elbow: Secondary | ICD-10-CM | POA: Diagnosis not present

## 2015-09-20 DIAGNOSIS — R531 Weakness: Secondary | ICD-10-CM | POA: Diagnosis not present

## 2015-09-20 DIAGNOSIS — M25521 Pain in right elbow: Secondary | ICD-10-CM | POA: Diagnosis not present

## 2015-09-22 DIAGNOSIS — M25521 Pain in right elbow: Secondary | ICD-10-CM | POA: Diagnosis not present

## 2015-09-22 DIAGNOSIS — R531 Weakness: Secondary | ICD-10-CM | POA: Diagnosis not present

## 2015-09-22 DIAGNOSIS — M25522 Pain in left elbow: Secondary | ICD-10-CM | POA: Diagnosis not present

## 2015-09-23 DIAGNOSIS — G4733 Obstructive sleep apnea (adult) (pediatric): Secondary | ICD-10-CM | POA: Diagnosis not present

## 2015-09-23 DIAGNOSIS — J019 Acute sinusitis, unspecified: Secondary | ICD-10-CM | POA: Diagnosis not present

## 2015-09-23 DIAGNOSIS — J3089 Other allergic rhinitis: Secondary | ICD-10-CM | POA: Diagnosis not present

## 2015-09-23 DIAGNOSIS — M25521 Pain in right elbow: Secondary | ICD-10-CM | POA: Diagnosis not present

## 2015-09-23 DIAGNOSIS — M25522 Pain in left elbow: Secondary | ICD-10-CM | POA: Diagnosis not present

## 2015-09-23 DIAGNOSIS — R05 Cough: Secondary | ICD-10-CM | POA: Diagnosis not present

## 2015-09-29 DIAGNOSIS — M25421 Effusion, right elbow: Secondary | ICD-10-CM | POA: Diagnosis not present

## 2015-09-29 DIAGNOSIS — M25521 Pain in right elbow: Secondary | ICD-10-CM | POA: Diagnosis not present

## 2015-10-06 DIAGNOSIS — M25521 Pain in right elbow: Secondary | ICD-10-CM | POA: Diagnosis not present

## 2015-10-06 DIAGNOSIS — M79671 Pain in right foot: Secondary | ICD-10-CM | POA: Diagnosis not present

## 2015-10-06 DIAGNOSIS — M7711 Lateral epicondylitis, right elbow: Secondary | ICD-10-CM | POA: Diagnosis not present

## 2015-10-07 DIAGNOSIS — Z85828 Personal history of other malignant neoplasm of skin: Secondary | ICD-10-CM | POA: Diagnosis not present

## 2015-10-07 DIAGNOSIS — D485 Neoplasm of uncertain behavior of skin: Secondary | ICD-10-CM | POA: Diagnosis not present

## 2015-10-07 DIAGNOSIS — L905 Scar conditions and fibrosis of skin: Secondary | ICD-10-CM | POA: Diagnosis not present

## 2015-10-07 DIAGNOSIS — L570 Actinic keratosis: Secondary | ICD-10-CM | POA: Diagnosis not present

## 2015-10-07 DIAGNOSIS — Z08 Encounter for follow-up examination after completed treatment for malignant neoplasm: Secondary | ICD-10-CM | POA: Diagnosis not present

## 2015-10-19 DIAGNOSIS — J3089 Other allergic rhinitis: Secondary | ICD-10-CM | POA: Diagnosis not present

## 2015-10-19 DIAGNOSIS — J019 Acute sinusitis, unspecified: Secondary | ICD-10-CM | POA: Diagnosis not present

## 2015-10-19 DIAGNOSIS — R05 Cough: Secondary | ICD-10-CM | POA: Diagnosis not present

## 2015-10-19 DIAGNOSIS — K210 Gastro-esophageal reflux disease with esophagitis: Secondary | ICD-10-CM | POA: Diagnosis not present

## 2015-10-19 DIAGNOSIS — J310 Chronic rhinitis: Secondary | ICD-10-CM | POA: Diagnosis not present

## 2015-10-20 DIAGNOSIS — K589 Irritable bowel syndrome without diarrhea: Secondary | ICD-10-CM | POA: Diagnosis not present

## 2015-10-20 DIAGNOSIS — K219 Gastro-esophageal reflux disease without esophagitis: Secondary | ICD-10-CM | POA: Diagnosis not present

## 2015-10-20 DIAGNOSIS — Z1211 Encounter for screening for malignant neoplasm of colon: Secondary | ICD-10-CM | POA: Diagnosis not present

## 2015-10-28 DIAGNOSIS — J392 Other diseases of pharynx: Secondary | ICD-10-CM | POA: Diagnosis not present

## 2015-10-28 DIAGNOSIS — G4730 Sleep apnea, unspecified: Secondary | ICD-10-CM | POA: Diagnosis not present

## 2015-10-28 DIAGNOSIS — Z882 Allergy status to sulfonamides status: Secondary | ICD-10-CM | POA: Diagnosis not present

## 2015-10-28 DIAGNOSIS — K219 Gastro-esophageal reflux disease without esophagitis: Secondary | ICD-10-CM | POA: Diagnosis not present

## 2015-10-28 DIAGNOSIS — Z885 Allergy status to narcotic agent status: Secondary | ICD-10-CM | POA: Diagnosis not present

## 2015-10-28 DIAGNOSIS — M542 Cervicalgia: Secondary | ICD-10-CM | POA: Diagnosis not present

## 2015-10-28 DIAGNOSIS — Z9989 Dependence on other enabling machines and devices: Secondary | ICD-10-CM | POA: Diagnosis not present

## 2015-10-28 DIAGNOSIS — R1310 Dysphagia, unspecified: Secondary | ICD-10-CM | POA: Diagnosis not present

## 2015-10-28 DIAGNOSIS — M797 Fibromyalgia: Secondary | ICD-10-CM | POA: Diagnosis not present

## 2015-10-28 DIAGNOSIS — Z880 Allergy status to penicillin: Secondary | ICD-10-CM | POA: Diagnosis not present

## 2015-10-28 DIAGNOSIS — R1319 Other dysphagia: Secondary | ICD-10-CM | POA: Diagnosis not present

## 2015-10-28 DIAGNOSIS — Z886 Allergy status to analgesic agent status: Secondary | ICD-10-CM | POA: Diagnosis not present

## 2015-10-29 DIAGNOSIS — R1310 Dysphagia, unspecified: Secondary | ICD-10-CM | POA: Diagnosis not present

## 2015-10-29 DIAGNOSIS — J392 Other diseases of pharynx: Secondary | ICD-10-CM | POA: Diagnosis not present

## 2015-10-29 DIAGNOSIS — M542 Cervicalgia: Secondary | ICD-10-CM | POA: Diagnosis not present

## 2015-10-29 DIAGNOSIS — M797 Fibromyalgia: Secondary | ICD-10-CM | POA: Diagnosis not present

## 2015-10-29 DIAGNOSIS — K219 Gastro-esophageal reflux disease without esophagitis: Secondary | ICD-10-CM | POA: Diagnosis not present

## 2015-11-04 DIAGNOSIS — K219 Gastro-esophageal reflux disease without esophagitis: Secondary | ICD-10-CM | POA: Diagnosis not present

## 2015-11-04 DIAGNOSIS — M797 Fibromyalgia: Secondary | ICD-10-CM | POA: Diagnosis not present

## 2015-11-04 DIAGNOSIS — R69 Illness, unspecified: Secondary | ICD-10-CM | POA: Diagnosis not present

## 2015-11-04 DIAGNOSIS — E785 Hyperlipidemia, unspecified: Secondary | ICD-10-CM | POA: Diagnosis not present

## 2015-11-04 DIAGNOSIS — M7711 Lateral epicondylitis, right elbow: Secondary | ICD-10-CM | POA: Diagnosis not present

## 2015-11-04 DIAGNOSIS — Z79899 Other long term (current) drug therapy: Secondary | ICD-10-CM | POA: Diagnosis not present

## 2015-11-04 DIAGNOSIS — M1990 Unspecified osteoarthritis, unspecified site: Secondary | ICD-10-CM | POA: Diagnosis not present

## 2015-11-04 DIAGNOSIS — G4730 Sleep apnea, unspecified: Secondary | ICD-10-CM | POA: Diagnosis not present

## 2015-11-04 DIAGNOSIS — Z882 Allergy status to sulfonamides status: Secondary | ICD-10-CM | POA: Diagnosis not present

## 2015-11-04 DIAGNOSIS — Z880 Allergy status to penicillin: Secondary | ICD-10-CM | POA: Diagnosis not present

## 2015-11-18 DIAGNOSIS — G3184 Mild cognitive impairment, so stated: Secondary | ICD-10-CM | POA: Diagnosis not present

## 2015-11-18 DIAGNOSIS — G6289 Other specified polyneuropathies: Secondary | ICD-10-CM | POA: Diagnosis not present

## 2015-11-18 DIAGNOSIS — M542 Cervicalgia: Secondary | ICD-10-CM | POA: Diagnosis not present

## 2015-11-18 DIAGNOSIS — M797 Fibromyalgia: Secondary | ICD-10-CM | POA: Diagnosis not present

## 2015-11-30 DIAGNOSIS — M25561 Pain in right knee: Secondary | ICD-10-CM | POA: Diagnosis not present

## 2015-11-30 DIAGNOSIS — M1711 Unilateral primary osteoarthritis, right knee: Secondary | ICD-10-CM | POA: Insufficient documentation

## 2015-12-08 DIAGNOSIS — R69 Illness, unspecified: Secondary | ICD-10-CM | POA: Diagnosis not present

## 2015-12-09 DIAGNOSIS — H2513 Age-related nuclear cataract, bilateral: Secondary | ICD-10-CM | POA: Diagnosis not present

## 2015-12-09 DIAGNOSIS — H5203 Hypermetropia, bilateral: Secondary | ICD-10-CM | POA: Diagnosis not present

## 2015-12-16 DIAGNOSIS — K3189 Other diseases of stomach and duodenum: Secondary | ICD-10-CM | POA: Diagnosis not present

## 2015-12-16 DIAGNOSIS — K219 Gastro-esophageal reflux disease without esophagitis: Secondary | ICD-10-CM | POA: Diagnosis not present

## 2015-12-22 DIAGNOSIS — M1711 Unilateral primary osteoarthritis, right knee: Secondary | ICD-10-CM | POA: Diagnosis not present

## 2015-12-29 DIAGNOSIS — H524 Presbyopia: Secondary | ICD-10-CM | POA: Diagnosis not present

## 2015-12-29 DIAGNOSIS — H5203 Hypermetropia, bilateral: Secondary | ICD-10-CM | POA: Diagnosis not present

## 2015-12-29 DIAGNOSIS — H52209 Unspecified astigmatism, unspecified eye: Secondary | ICD-10-CM | POA: Diagnosis not present

## 2015-12-29 DIAGNOSIS — Z0100 Encounter for examination of eyes and vision without abnormal findings: Secondary | ICD-10-CM | POA: Diagnosis not present

## 2016-01-14 DIAGNOSIS — M1711 Unilateral primary osteoarthritis, right knee: Secondary | ICD-10-CM | POA: Diagnosis not present

## 2016-01-14 DIAGNOSIS — M25521 Pain in right elbow: Secondary | ICD-10-CM | POA: Diagnosis not present

## 2016-01-14 DIAGNOSIS — M7701 Medial epicondylitis, right elbow: Secondary | ICD-10-CM | POA: Insufficient documentation

## 2016-01-14 DIAGNOSIS — M797 Fibromyalgia: Secondary | ICD-10-CM | POA: Diagnosis not present

## 2016-01-18 DIAGNOSIS — Z1231 Encounter for screening mammogram for malignant neoplasm of breast: Secondary | ICD-10-CM | POA: Diagnosis not present

## 2016-01-19 DIAGNOSIS — Z0000 Encounter for general adult medical examination without abnormal findings: Secondary | ICD-10-CM | POA: Diagnosis not present

## 2016-01-19 DIAGNOSIS — R69 Illness, unspecified: Secondary | ICD-10-CM | POA: Diagnosis not present

## 2016-01-19 DIAGNOSIS — E7800 Pure hypercholesterolemia, unspecified: Secondary | ICD-10-CM | POA: Diagnosis not present

## 2016-01-20 DIAGNOSIS — Z0000 Encounter for general adult medical examination without abnormal findings: Secondary | ICD-10-CM | POA: Diagnosis not present

## 2016-01-20 DIAGNOSIS — J309 Allergic rhinitis, unspecified: Secondary | ICD-10-CM | POA: Diagnosis not present

## 2016-01-20 DIAGNOSIS — K219 Gastro-esophageal reflux disease without esophagitis: Secondary | ICD-10-CM | POA: Diagnosis not present

## 2016-01-20 DIAGNOSIS — E785 Hyperlipidemia, unspecified: Secondary | ICD-10-CM | POA: Diagnosis not present

## 2016-01-20 DIAGNOSIS — M8580 Other specified disorders of bone density and structure, unspecified site: Secondary | ICD-10-CM | POA: Diagnosis not present

## 2016-01-20 DIAGNOSIS — Z1231 Encounter for screening mammogram for malignant neoplasm of breast: Secondary | ICD-10-CM | POA: Diagnosis not present

## 2016-02-09 DIAGNOSIS — K581 Irritable bowel syndrome with constipation: Secondary | ICD-10-CM | POA: Diagnosis not present

## 2016-02-09 DIAGNOSIS — K5900 Constipation, unspecified: Secondary | ICD-10-CM | POA: Diagnosis not present

## 2016-02-09 DIAGNOSIS — K219 Gastro-esophageal reflux disease without esophagitis: Secondary | ICD-10-CM | POA: Diagnosis not present

## 2016-02-09 DIAGNOSIS — I878 Other specified disorders of veins: Secondary | ICD-10-CM | POA: Diagnosis not present

## 2016-02-09 DIAGNOSIS — R1084 Generalized abdominal pain: Secondary | ICD-10-CM | POA: Diagnosis not present

## 2016-02-09 DIAGNOSIS — R140 Abdominal distension (gaseous): Secondary | ICD-10-CM | POA: Diagnosis not present

## 2016-02-10 DIAGNOSIS — D225 Melanocytic nevi of trunk: Secondary | ICD-10-CM | POA: Diagnosis not present

## 2016-02-10 DIAGNOSIS — L821 Other seborrheic keratosis: Secondary | ICD-10-CM | POA: Diagnosis not present

## 2016-02-10 DIAGNOSIS — L570 Actinic keratosis: Secondary | ICD-10-CM | POA: Diagnosis not present

## 2016-02-14 DIAGNOSIS — R1084 Generalized abdominal pain: Secondary | ICD-10-CM | POA: Diagnosis not present

## 2016-02-14 DIAGNOSIS — K581 Irritable bowel syndrome with constipation: Secondary | ICD-10-CM | POA: Diagnosis not present

## 2016-03-15 DIAGNOSIS — M25561 Pain in right knee: Secondary | ICD-10-CM | POA: Diagnosis not present

## 2016-03-15 DIAGNOSIS — M25521 Pain in right elbow: Secondary | ICD-10-CM | POA: Diagnosis not present

## 2016-03-21 DIAGNOSIS — K219 Gastro-esophageal reflux disease without esophagitis: Secondary | ICD-10-CM | POA: Diagnosis not present

## 2016-03-21 DIAGNOSIS — R197 Diarrhea, unspecified: Secondary | ICD-10-CM | POA: Diagnosis not present

## 2016-03-21 DIAGNOSIS — R1310 Dysphagia, unspecified: Secondary | ICD-10-CM | POA: Diagnosis not present

## 2016-04-03 DIAGNOSIS — R109 Unspecified abdominal pain: Secondary | ICD-10-CM | POA: Diagnosis not present

## 2016-04-03 DIAGNOSIS — K6389 Other specified diseases of intestine: Secondary | ICD-10-CM | POA: Diagnosis not present

## 2016-04-03 DIAGNOSIS — K5900 Constipation, unspecified: Secondary | ICD-10-CM | POA: Diagnosis not present

## 2016-04-18 DIAGNOSIS — R1310 Dysphagia, unspecified: Secondary | ICD-10-CM | POA: Diagnosis not present

## 2016-04-18 DIAGNOSIS — K219 Gastro-esophageal reflux disease without esophagitis: Secondary | ICD-10-CM | POA: Diagnosis not present

## 2016-04-19 DIAGNOSIS — R05 Cough: Secondary | ICD-10-CM | POA: Diagnosis not present

## 2016-04-19 DIAGNOSIS — J019 Acute sinusitis, unspecified: Secondary | ICD-10-CM | POA: Diagnosis not present

## 2016-04-19 DIAGNOSIS — J3089 Other allergic rhinitis: Secondary | ICD-10-CM | POA: Diagnosis not present

## 2016-04-19 DIAGNOSIS — T783XXA Angioneurotic edema, initial encounter: Secondary | ICD-10-CM | POA: Diagnosis not present

## 2016-04-19 DIAGNOSIS — J310 Chronic rhinitis: Secondary | ICD-10-CM | POA: Diagnosis not present

## 2016-05-04 DIAGNOSIS — L820 Inflamed seborrheic keratosis: Secondary | ICD-10-CM | POA: Diagnosis not present

## 2016-05-04 DIAGNOSIS — H61001 Unspecified perichondritis of right external ear: Secondary | ICD-10-CM | POA: Diagnosis not present

## 2016-05-10 DIAGNOSIS — K219 Gastro-esophageal reflux disease without esophagitis: Secondary | ICD-10-CM | POA: Diagnosis not present

## 2016-05-10 DIAGNOSIS — K589 Irritable bowel syndrome without diarrhea: Secondary | ICD-10-CM | POA: Diagnosis not present

## 2016-05-10 DIAGNOSIS — R109 Unspecified abdominal pain: Secondary | ICD-10-CM | POA: Diagnosis not present

## 2016-05-10 DIAGNOSIS — K5900 Constipation, unspecified: Secondary | ICD-10-CM | POA: Diagnosis not present

## 2016-05-25 DIAGNOSIS — L820 Inflamed seborrheic keratosis: Secondary | ICD-10-CM | POA: Diagnosis not present

## 2016-05-25 DIAGNOSIS — H61001 Unspecified perichondritis of right external ear: Secondary | ICD-10-CM | POA: Diagnosis not present

## 2016-05-29 DIAGNOSIS — M25522 Pain in left elbow: Secondary | ICD-10-CM | POA: Diagnosis not present

## 2016-05-29 DIAGNOSIS — M25521 Pain in right elbow: Secondary | ICD-10-CM | POA: Diagnosis not present

## 2016-05-31 DIAGNOSIS — K6389 Other specified diseases of intestine: Secondary | ICD-10-CM | POA: Diagnosis not present

## 2016-05-31 DIAGNOSIS — R140 Abdominal distension (gaseous): Secondary | ICD-10-CM | POA: Diagnosis not present

## 2016-06-01 DIAGNOSIS — M25521 Pain in right elbow: Secondary | ICD-10-CM | POA: Diagnosis not present

## 2016-06-02 DIAGNOSIS — M19021 Primary osteoarthritis, right elbow: Secondary | ICD-10-CM | POA: Diagnosis not present

## 2016-06-02 DIAGNOSIS — M25421 Effusion, right elbow: Secondary | ICD-10-CM | POA: Diagnosis not present

## 2016-06-05 DIAGNOSIS — R140 Abdominal distension (gaseous): Secondary | ICD-10-CM | POA: Diagnosis not present

## 2016-06-08 DIAGNOSIS — K589 Irritable bowel syndrome without diarrhea: Secondary | ICD-10-CM | POA: Diagnosis not present

## 2016-06-08 DIAGNOSIS — K219 Gastro-esophageal reflux disease without esophagitis: Secondary | ICD-10-CM | POA: Diagnosis not present

## 2016-06-09 DIAGNOSIS — M25521 Pain in right elbow: Secondary | ICD-10-CM | POA: Diagnosis not present

## 2016-06-09 DIAGNOSIS — M1711 Unilateral primary osteoarthritis, right knee: Secondary | ICD-10-CM | POA: Diagnosis not present

## 2016-06-09 DIAGNOSIS — M7072 Other bursitis of hip, left hip: Secondary | ICD-10-CM | POA: Diagnosis not present

## 2016-06-09 DIAGNOSIS — M25561 Pain in right knee: Secondary | ICD-10-CM | POA: Diagnosis not present

## 2016-06-22 DIAGNOSIS — R69 Illness, unspecified: Secondary | ICD-10-CM | POA: Diagnosis not present

## 2016-07-03 DIAGNOSIS — R69 Illness, unspecified: Secondary | ICD-10-CM | POA: Diagnosis not present

## 2016-07-20 DIAGNOSIS — M25511 Pain in right shoulder: Secondary | ICD-10-CM | POA: Diagnosis not present

## 2016-08-02 DIAGNOSIS — M8580 Other specified disorders of bone density and structure, unspecified site: Secondary | ICD-10-CM | POA: Diagnosis not present

## 2016-08-02 DIAGNOSIS — E785 Hyperlipidemia, unspecified: Secondary | ICD-10-CM | POA: Diagnosis not present

## 2016-08-02 DIAGNOSIS — K5790 Diverticulosis of intestine, part unspecified, without perforation or abscess without bleeding: Secondary | ICD-10-CM | POA: Diagnosis not present

## 2016-08-02 DIAGNOSIS — D649 Anemia, unspecified: Secondary | ICD-10-CM | POA: Diagnosis not present

## 2016-08-10 DIAGNOSIS — M797 Fibromyalgia: Secondary | ICD-10-CM | POA: Diagnosis not present

## 2016-08-10 DIAGNOSIS — M542 Cervicalgia: Secondary | ICD-10-CM | POA: Diagnosis not present

## 2016-08-17 DIAGNOSIS — Z08 Encounter for follow-up examination after completed treatment for malignant neoplasm: Secondary | ICD-10-CM | POA: Diagnosis not present

## 2016-08-17 DIAGNOSIS — Z85828 Personal history of other malignant neoplasm of skin: Secondary | ICD-10-CM | POA: Diagnosis not present

## 2016-08-23 DIAGNOSIS — M25519 Pain in unspecified shoulder: Secondary | ICD-10-CM | POA: Diagnosis not present

## 2016-08-23 DIAGNOSIS — M791 Myalgia: Secondary | ICD-10-CM | POA: Diagnosis not present

## 2016-08-23 DIAGNOSIS — Z79891 Long term (current) use of opiate analgesic: Secondary | ICD-10-CM | POA: Diagnosis not present

## 2016-08-23 DIAGNOSIS — M542 Cervicalgia: Secondary | ICD-10-CM | POA: Diagnosis not present

## 2016-08-23 DIAGNOSIS — M25521 Pain in right elbow: Secondary | ICD-10-CM | POA: Diagnosis not present

## 2016-09-05 DIAGNOSIS — M791 Myalgia: Secondary | ICD-10-CM | POA: Diagnosis not present

## 2016-09-18 DIAGNOSIS — R69 Illness, unspecified: Secondary | ICD-10-CM | POA: Diagnosis not present

## 2016-09-20 DIAGNOSIS — R202 Paresthesia of skin: Secondary | ICD-10-CM | POA: Diagnosis not present

## 2016-09-20 DIAGNOSIS — M5416 Radiculopathy, lumbar region: Secondary | ICD-10-CM | POA: Diagnosis not present

## 2016-09-20 DIAGNOSIS — M5412 Radiculopathy, cervical region: Secondary | ICD-10-CM | POA: Diagnosis not present

## 2016-09-20 DIAGNOSIS — G3184 Mild cognitive impairment, so stated: Secondary | ICD-10-CM | POA: Diagnosis not present

## 2016-09-20 DIAGNOSIS — G5603 Carpal tunnel syndrome, bilateral upper limbs: Secondary | ICD-10-CM | POA: Diagnosis not present

## 2016-09-20 DIAGNOSIS — M797 Fibromyalgia: Secondary | ICD-10-CM | POA: Diagnosis not present

## 2016-09-28 DIAGNOSIS — M25561 Pain in right knee: Secondary | ICD-10-CM | POA: Diagnosis not present

## 2016-10-03 DIAGNOSIS — M791 Myalgia: Secondary | ICD-10-CM | POA: Diagnosis not present

## 2016-10-31 DIAGNOSIS — M791 Myalgia: Secondary | ICD-10-CM | POA: Diagnosis not present

## 2016-10-31 DIAGNOSIS — M797 Fibromyalgia: Secondary | ICD-10-CM | POA: Diagnosis not present

## 2016-11-20 DIAGNOSIS — E785 Hyperlipidemia, unspecified: Secondary | ICD-10-CM | POA: Diagnosis not present

## 2016-11-20 DIAGNOSIS — K5790 Diverticulosis of intestine, part unspecified, without perforation or abscess without bleeding: Secondary | ICD-10-CM | POA: Diagnosis not present

## 2016-11-20 DIAGNOSIS — D649 Anemia, unspecified: Secondary | ICD-10-CM | POA: Diagnosis not present

## 2016-11-20 DIAGNOSIS — Z111 Encounter for screening for respiratory tuberculosis: Secondary | ICD-10-CM | POA: Diagnosis not present

## 2016-12-01 DIAGNOSIS — G4733 Obstructive sleep apnea (adult) (pediatric): Secondary | ICD-10-CM | POA: Diagnosis not present

## 2016-12-07 DIAGNOSIS — G4733 Obstructive sleep apnea (adult) (pediatric): Secondary | ICD-10-CM | POA: Diagnosis not present

## 2016-12-11 DIAGNOSIS — M542 Cervicalgia: Secondary | ICD-10-CM | POA: Diagnosis not present

## 2016-12-11 DIAGNOSIS — M791 Myalgia: Secondary | ICD-10-CM | POA: Diagnosis not present

## 2016-12-11 DIAGNOSIS — M5481 Occipital neuralgia: Secondary | ICD-10-CM | POA: Diagnosis not present

## 2016-12-12 DIAGNOSIS — M5481 Occipital neuralgia: Secondary | ICD-10-CM | POA: Diagnosis not present

## 2016-12-12 DIAGNOSIS — M791 Myalgia: Secondary | ICD-10-CM | POA: Diagnosis not present

## 2016-12-12 DIAGNOSIS — R51 Headache: Secondary | ICD-10-CM | POA: Diagnosis not present

## 2016-12-18 DIAGNOSIS — R69 Illness, unspecified: Secondary | ICD-10-CM | POA: Diagnosis not present

## 2017-01-01 DIAGNOSIS — G4733 Obstructive sleep apnea (adult) (pediatric): Secondary | ICD-10-CM | POA: Diagnosis not present

## 2017-01-15 DIAGNOSIS — R69 Illness, unspecified: Secondary | ICD-10-CM | POA: Diagnosis not present

## 2017-01-15 DIAGNOSIS — M25561 Pain in right knee: Secondary | ICD-10-CM | POA: Diagnosis not present

## 2017-01-15 DIAGNOSIS — M1711 Unilateral primary osteoarthritis, right knee: Secondary | ICD-10-CM | POA: Diagnosis not present

## 2017-01-16 DIAGNOSIS — M791 Myalgia: Secondary | ICD-10-CM | POA: Diagnosis not present

## 2017-01-18 DIAGNOSIS — Z1231 Encounter for screening mammogram for malignant neoplasm of breast: Secondary | ICD-10-CM | POA: Diagnosis not present

## 2017-01-18 DIAGNOSIS — Z139 Encounter for screening, unspecified: Secondary | ICD-10-CM | POA: Diagnosis not present

## 2017-01-18 DIAGNOSIS — Z780 Asymptomatic menopausal state: Secondary | ICD-10-CM | POA: Diagnosis not present

## 2017-01-18 DIAGNOSIS — M85832 Other specified disorders of bone density and structure, left forearm: Secondary | ICD-10-CM | POA: Diagnosis not present

## 2017-01-18 DIAGNOSIS — M85851 Other specified disorders of bone density and structure, right thigh: Secondary | ICD-10-CM | POA: Diagnosis not present

## 2017-01-18 DIAGNOSIS — M8580 Other specified disorders of bone density and structure, unspecified site: Secondary | ICD-10-CM | POA: Diagnosis not present

## 2017-01-25 DIAGNOSIS — M8580 Other specified disorders of bone density and structure, unspecified site: Secondary | ICD-10-CM | POA: Diagnosis not present

## 2017-01-25 DIAGNOSIS — R7989 Other specified abnormal findings of blood chemistry: Secondary | ICD-10-CM | POA: Diagnosis not present

## 2017-01-25 DIAGNOSIS — M170 Bilateral primary osteoarthritis of knee: Secondary | ICD-10-CM | POA: Diagnosis not present

## 2017-01-25 DIAGNOSIS — Z0000 Encounter for general adult medical examination without abnormal findings: Secondary | ICD-10-CM | POA: Diagnosis not present

## 2017-01-25 DIAGNOSIS — Z23 Encounter for immunization: Secondary | ICD-10-CM | POA: Diagnosis not present

## 2017-01-25 DIAGNOSIS — Z1231 Encounter for screening mammogram for malignant neoplasm of breast: Secondary | ICD-10-CM | POA: Diagnosis not present

## 2017-01-25 DIAGNOSIS — R5383 Other fatigue: Secondary | ICD-10-CM | POA: Diagnosis not present

## 2017-01-25 DIAGNOSIS — Z862 Personal history of diseases of the blood and blood-forming organs and certain disorders involving the immune mechanism: Secondary | ICD-10-CM | POA: Diagnosis not present

## 2017-01-25 DIAGNOSIS — M899 Disorder of bone, unspecified: Secondary | ICD-10-CM | POA: Diagnosis not present

## 2017-01-25 DIAGNOSIS — E785 Hyperlipidemia, unspecified: Secondary | ICD-10-CM | POA: Diagnosis not present

## 2017-01-30 DIAGNOSIS — E7800 Pure hypercholesterolemia, unspecified: Secondary | ICD-10-CM | POA: Diagnosis not present

## 2017-01-30 DIAGNOSIS — H524 Presbyopia: Secondary | ICD-10-CM | POA: Diagnosis not present

## 2017-01-31 DIAGNOSIS — G4733 Obstructive sleep apnea (adult) (pediatric): Secondary | ICD-10-CM | POA: Diagnosis not present

## 2017-02-13 DIAGNOSIS — B079 Viral wart, unspecified: Secondary | ICD-10-CM | POA: Diagnosis not present

## 2017-02-15 DIAGNOSIS — Z08 Encounter for follow-up examination after completed treatment for malignant neoplasm: Secondary | ICD-10-CM | POA: Diagnosis not present

## 2017-02-15 DIAGNOSIS — L570 Actinic keratosis: Secondary | ICD-10-CM | POA: Diagnosis not present

## 2017-02-15 DIAGNOSIS — Z85828 Personal history of other malignant neoplasm of skin: Secondary | ICD-10-CM | POA: Diagnosis not present

## 2017-02-16 DIAGNOSIS — D473 Essential (hemorrhagic) thrombocythemia: Secondary | ICD-10-CM | POA: Diagnosis not present

## 2017-03-03 DIAGNOSIS — G4733 Obstructive sleep apnea (adult) (pediatric): Secondary | ICD-10-CM | POA: Diagnosis not present

## 2017-03-14 DIAGNOSIS — M791 Myalgia, unspecified site: Secondary | ICD-10-CM | POA: Diagnosis not present

## 2017-03-14 DIAGNOSIS — M5481 Occipital neuralgia: Secondary | ICD-10-CM | POA: Diagnosis not present

## 2017-03-14 DIAGNOSIS — M542 Cervicalgia: Secondary | ICD-10-CM | POA: Diagnosis not present

## 2017-03-20 DIAGNOSIS — R69 Illness, unspecified: Secondary | ICD-10-CM | POA: Diagnosis not present

## 2017-03-29 DIAGNOSIS — M1711 Unilateral primary osteoarthritis, right knee: Secondary | ICD-10-CM | POA: Diagnosis not present

## 2017-04-02 DIAGNOSIS — G4733 Obstructive sleep apnea (adult) (pediatric): Secondary | ICD-10-CM | POA: Diagnosis not present

## 2017-04-06 DIAGNOSIS — M1711 Unilateral primary osteoarthritis, right knee: Secondary | ICD-10-CM | POA: Diagnosis not present

## 2017-04-13 DIAGNOSIS — M25561 Pain in right knee: Secondary | ICD-10-CM | POA: Diagnosis not present

## 2017-04-13 DIAGNOSIS — M1711 Unilateral primary osteoarthritis, right knee: Secondary | ICD-10-CM | POA: Diagnosis not present

## 2017-04-20 DIAGNOSIS — M25511 Pain in right shoulder: Secondary | ICD-10-CM | POA: Diagnosis not present

## 2017-04-20 DIAGNOSIS — M7521 Bicipital tendinitis, right shoulder: Secondary | ICD-10-CM | POA: Insufficient documentation

## 2017-05-03 DIAGNOSIS — G4733 Obstructive sleep apnea (adult) (pediatric): Secondary | ICD-10-CM | POA: Diagnosis not present

## 2017-05-07 DIAGNOSIS — M25521 Pain in right elbow: Secondary | ICD-10-CM | POA: Diagnosis not present

## 2017-05-16 DIAGNOSIS — M7521 Bicipital tendinitis, right shoulder: Secondary | ICD-10-CM | POA: Diagnosis not present

## 2017-05-16 DIAGNOSIS — M25522 Pain in left elbow: Secondary | ICD-10-CM | POA: Diagnosis not present

## 2017-05-16 DIAGNOSIS — M7711 Lateral epicondylitis, right elbow: Secondary | ICD-10-CM | POA: Diagnosis not present

## 2017-06-03 DIAGNOSIS — G4733 Obstructive sleep apnea (adult) (pediatric): Secondary | ICD-10-CM | POA: Diagnosis not present

## 2017-06-15 DIAGNOSIS — R591 Generalized enlarged lymph nodes: Secondary | ICD-10-CM | POA: Diagnosis not present

## 2017-06-15 DIAGNOSIS — R490 Dysphonia: Secondary | ICD-10-CM | POA: Diagnosis not present

## 2017-06-21 DIAGNOSIS — R69 Illness, unspecified: Secondary | ICD-10-CM | POA: Diagnosis not present

## 2017-06-27 DIAGNOSIS — M25561 Pain in right knee: Secondary | ICD-10-CM | POA: Diagnosis not present

## 2017-06-27 DIAGNOSIS — M1711 Unilateral primary osteoarthritis, right knee: Secondary | ICD-10-CM | POA: Diagnosis not present

## 2017-07-01 DIAGNOSIS — G4733 Obstructive sleep apnea (adult) (pediatric): Secondary | ICD-10-CM | POA: Diagnosis not present

## 2017-07-13 DIAGNOSIS — E785 Hyperlipidemia, unspecified: Secondary | ICD-10-CM | POA: Diagnosis not present

## 2017-07-13 DIAGNOSIS — J45909 Unspecified asthma, uncomplicated: Secondary | ICD-10-CM | POA: Diagnosis not present

## 2017-07-13 DIAGNOSIS — D473 Essential (hemorrhagic) thrombocythemia: Secondary | ICD-10-CM | POA: Diagnosis not present

## 2017-07-13 DIAGNOSIS — M8580 Other specified disorders of bone density and structure, unspecified site: Secondary | ICD-10-CM | POA: Diagnosis not present

## 2017-07-16 DIAGNOSIS — R69 Illness, unspecified: Secondary | ICD-10-CM | POA: Diagnosis not present

## 2017-07-18 DIAGNOSIS — K5900 Constipation, unspecified: Secondary | ICD-10-CM | POA: Diagnosis not present

## 2017-07-18 DIAGNOSIS — K589 Irritable bowel syndrome without diarrhea: Secondary | ICD-10-CM | POA: Diagnosis not present

## 2017-07-24 DIAGNOSIS — M79601 Pain in right arm: Secondary | ICD-10-CM | POA: Diagnosis not present

## 2017-07-24 DIAGNOSIS — M25521 Pain in right elbow: Secondary | ICD-10-CM | POA: Diagnosis not present

## 2017-07-24 DIAGNOSIS — M7918 Myalgia, other site: Secondary | ICD-10-CM | POA: Diagnosis not present

## 2017-07-25 DIAGNOSIS — R69 Illness, unspecified: Secondary | ICD-10-CM | POA: Diagnosis not present

## 2017-08-01 DIAGNOSIS — T783XXA Angioneurotic edema, initial encounter: Secondary | ICD-10-CM | POA: Diagnosis not present

## 2017-08-01 DIAGNOSIS — J3089 Other allergic rhinitis: Secondary | ICD-10-CM | POA: Diagnosis not present

## 2017-08-01 DIAGNOSIS — J310 Chronic rhinitis: Secondary | ICD-10-CM | POA: Diagnosis not present

## 2017-08-01 DIAGNOSIS — R05 Cough: Secondary | ICD-10-CM | POA: Diagnosis not present

## 2017-08-01 DIAGNOSIS — G4733 Obstructive sleep apnea (adult) (pediatric): Secondary | ICD-10-CM | POA: Diagnosis not present

## 2017-08-23 DIAGNOSIS — L570 Actinic keratosis: Secondary | ICD-10-CM | POA: Diagnosis not present

## 2017-08-23 DIAGNOSIS — Z08 Encounter for follow-up examination after completed treatment for malignant neoplasm: Secondary | ICD-10-CM | POA: Diagnosis not present

## 2017-08-23 DIAGNOSIS — Z85828 Personal history of other malignant neoplasm of skin: Secondary | ICD-10-CM | POA: Diagnosis not present

## 2017-08-29 DIAGNOSIS — M25561 Pain in right knee: Secondary | ICD-10-CM | POA: Diagnosis not present

## 2017-08-31 DIAGNOSIS — G4733 Obstructive sleep apnea (adult) (pediatric): Secondary | ICD-10-CM | POA: Diagnosis not present

## 2017-09-11 DIAGNOSIS — M5481 Occipital neuralgia: Secondary | ICD-10-CM | POA: Diagnosis not present

## 2017-10-15 DIAGNOSIS — M19049 Primary osteoarthritis, unspecified hand: Secondary | ICD-10-CM | POA: Insufficient documentation

## 2017-10-15 DIAGNOSIS — M76891 Other specified enthesopathies of right lower limb, excluding foot: Secondary | ICD-10-CM | POA: Insufficient documentation

## 2017-10-15 DIAGNOSIS — M25531 Pain in right wrist: Secondary | ICD-10-CM | POA: Diagnosis not present

## 2017-10-17 DIAGNOSIS — R69 Illness, unspecified: Secondary | ICD-10-CM | POA: Diagnosis not present

## 2017-10-30 DIAGNOSIS — M542 Cervicalgia: Secondary | ICD-10-CM | POA: Diagnosis not present

## 2017-10-30 DIAGNOSIS — M791 Myalgia, unspecified site: Secondary | ICD-10-CM | POA: Diagnosis not present

## 2017-12-25 DIAGNOSIS — M791 Myalgia, unspecified site: Secondary | ICD-10-CM | POA: Diagnosis not present

## 2017-12-27 DIAGNOSIS — M797 Fibromyalgia: Secondary | ICD-10-CM | POA: Diagnosis not present

## 2017-12-27 DIAGNOSIS — M5417 Radiculopathy, lumbosacral region: Secondary | ICD-10-CM | POA: Diagnosis not present

## 2017-12-27 DIAGNOSIS — C44719 Basal cell carcinoma of skin of left lower limb, including hip: Secondary | ICD-10-CM | POA: Diagnosis not present

## 2017-12-27 DIAGNOSIS — D485 Neoplasm of uncertain behavior of skin: Secondary | ICD-10-CM | POA: Diagnosis not present

## 2017-12-27 DIAGNOSIS — G3184 Mild cognitive impairment, so stated: Secondary | ICD-10-CM | POA: Diagnosis not present

## 2017-12-27 DIAGNOSIS — L570 Actinic keratosis: Secondary | ICD-10-CM | POA: Diagnosis not present

## 2017-12-27 DIAGNOSIS — M5412 Radiculopathy, cervical region: Secondary | ICD-10-CM | POA: Diagnosis not present

## 2017-12-27 DIAGNOSIS — L820 Inflamed seborrheic keratosis: Secondary | ICD-10-CM | POA: Diagnosis not present

## 2018-01-02 DIAGNOSIS — M1711 Unilateral primary osteoarthritis, right knee: Secondary | ICD-10-CM | POA: Diagnosis not present

## 2018-01-09 DIAGNOSIS — M1711 Unilateral primary osteoarthritis, right knee: Secondary | ICD-10-CM | POA: Diagnosis not present

## 2018-01-15 DIAGNOSIS — M1711 Unilateral primary osteoarthritis, right knee: Secondary | ICD-10-CM | POA: Diagnosis not present

## 2018-01-15 DIAGNOSIS — M25561 Pain in right knee: Secondary | ICD-10-CM | POA: Diagnosis not present

## 2018-01-16 DIAGNOSIS — Z23 Encounter for immunization: Secondary | ICD-10-CM | POA: Diagnosis not present

## 2018-01-16 DIAGNOSIS — Z1231 Encounter for screening mammogram for malignant neoplasm of breast: Secondary | ICD-10-CM | POA: Diagnosis not present

## 2018-01-16 DIAGNOSIS — M899 Disorder of bone, unspecified: Secondary | ICD-10-CM | POA: Diagnosis not present

## 2018-01-16 DIAGNOSIS — E785 Hyperlipidemia, unspecified: Secondary | ICD-10-CM | POA: Diagnosis not present

## 2018-01-16 DIAGNOSIS — D473 Essential (hemorrhagic) thrombocythemia: Secondary | ICD-10-CM | POA: Diagnosis not present

## 2018-01-16 DIAGNOSIS — R69 Illness, unspecified: Secondary | ICD-10-CM | POA: Diagnosis not present

## 2018-01-16 DIAGNOSIS — R5383 Other fatigue: Secondary | ICD-10-CM | POA: Diagnosis not present

## 2018-01-16 DIAGNOSIS — Z1289 Encounter for screening for malignant neoplasm of other sites: Secondary | ICD-10-CM | POA: Diagnosis not present

## 2018-01-16 DIAGNOSIS — M8580 Other specified disorders of bone density and structure, unspecified site: Secondary | ICD-10-CM | POA: Diagnosis not present

## 2018-01-16 DIAGNOSIS — M170 Bilateral primary osteoarthritis of knee: Secondary | ICD-10-CM | POA: Diagnosis not present

## 2018-01-16 DIAGNOSIS — Z01419 Encounter for gynecological examination (general) (routine) without abnormal findings: Secondary | ICD-10-CM | POA: Diagnosis not present

## 2018-01-16 DIAGNOSIS — Z0000 Encounter for general adult medical examination without abnormal findings: Secondary | ICD-10-CM | POA: Diagnosis not present

## 2018-01-17 DIAGNOSIS — R69 Illness, unspecified: Secondary | ICD-10-CM | POA: Diagnosis not present

## 2018-02-11 DIAGNOSIS — M79601 Pain in right arm: Secondary | ICD-10-CM | POA: Diagnosis not present

## 2018-02-11 DIAGNOSIS — M791 Myalgia, unspecified site: Secondary | ICD-10-CM | POA: Diagnosis not present

## 2018-02-11 DIAGNOSIS — G894 Chronic pain syndrome: Secondary | ICD-10-CM | POA: Diagnosis not present

## 2018-02-11 DIAGNOSIS — M542 Cervicalgia: Secondary | ICD-10-CM | POA: Diagnosis not present

## 2018-02-11 DIAGNOSIS — M25561 Pain in right knee: Secondary | ICD-10-CM | POA: Diagnosis not present

## 2018-02-25 DIAGNOSIS — Z1231 Encounter for screening mammogram for malignant neoplasm of breast: Secondary | ICD-10-CM | POA: Diagnosis not present

## 2018-02-25 DIAGNOSIS — E7800 Pure hypercholesterolemia, unspecified: Secondary | ICD-10-CM | POA: Diagnosis not present

## 2018-02-25 DIAGNOSIS — H5200 Hypermetropia, unspecified eye: Secondary | ICD-10-CM | POA: Diagnosis not present

## 2018-02-26 DIAGNOSIS — J069 Acute upper respiratory infection, unspecified: Secondary | ICD-10-CM | POA: Diagnosis not present

## 2018-02-26 DIAGNOSIS — J9801 Acute bronchospasm: Secondary | ICD-10-CM | POA: Diagnosis not present

## 2018-02-27 DIAGNOSIS — M791 Myalgia, unspecified site: Secondary | ICD-10-CM | POA: Diagnosis not present

## 2018-03-11 DIAGNOSIS — Z713 Dietary counseling and surveillance: Secondary | ICD-10-CM | POA: Diagnosis not present

## 2018-03-26 DIAGNOSIS — J069 Acute upper respiratory infection, unspecified: Secondary | ICD-10-CM | POA: Diagnosis not present

## 2018-03-26 DIAGNOSIS — C44719 Basal cell carcinoma of skin of left lower limb, including hip: Secondary | ICD-10-CM | POA: Diagnosis not present

## 2018-03-26 DIAGNOSIS — Q828 Other specified congenital malformations of skin: Secondary | ICD-10-CM | POA: Diagnosis not present

## 2018-03-26 DIAGNOSIS — J9801 Acute bronchospasm: Secondary | ICD-10-CM | POA: Diagnosis not present

## 2018-04-01 DIAGNOSIS — H5203 Hypermetropia, bilateral: Secondary | ICD-10-CM | POA: Diagnosis not present

## 2018-04-01 DIAGNOSIS — T783XXA Angioneurotic edema, initial encounter: Secondary | ICD-10-CM | POA: Diagnosis not present

## 2018-04-01 DIAGNOSIS — R05 Cough: Secondary | ICD-10-CM | POA: Diagnosis not present

## 2018-04-01 DIAGNOSIS — H52209 Unspecified astigmatism, unspecified eye: Secondary | ICD-10-CM | POA: Diagnosis not present

## 2018-04-01 DIAGNOSIS — J3089 Other allergic rhinitis: Secondary | ICD-10-CM | POA: Diagnosis not present

## 2018-04-01 DIAGNOSIS — H524 Presbyopia: Secondary | ICD-10-CM | POA: Diagnosis not present

## 2018-04-08 DIAGNOSIS — Z713 Dietary counseling and surveillance: Secondary | ICD-10-CM | POA: Diagnosis not present

## 2018-04-17 DIAGNOSIS — R7309 Other abnormal glucose: Secondary | ICD-10-CM | POA: Diagnosis not present

## 2018-04-17 DIAGNOSIS — R69 Illness, unspecified: Secondary | ICD-10-CM | POA: Diagnosis not present

## 2018-04-28 DIAGNOSIS — R7309 Other abnormal glucose: Secondary | ICD-10-CM | POA: Insufficient documentation

## 2018-05-08 DIAGNOSIS — R69 Illness, unspecified: Secondary | ICD-10-CM | POA: Diagnosis not present

## 2018-05-20 DIAGNOSIS — M25561 Pain in right knee: Secondary | ICD-10-CM | POA: Diagnosis not present

## 2018-05-20 DIAGNOSIS — Z713 Dietary counseling and surveillance: Secondary | ICD-10-CM | POA: Diagnosis not present

## 2018-05-21 DIAGNOSIS — M791 Myalgia, unspecified site: Secondary | ICD-10-CM | POA: Diagnosis not present

## 2018-05-23 DIAGNOSIS — M25561 Pain in right knee: Secondary | ICD-10-CM | POA: Diagnosis not present

## 2018-05-27 DIAGNOSIS — M25561 Pain in right knee: Secondary | ICD-10-CM | POA: Diagnosis not present

## 2018-05-29 DIAGNOSIS — M25561 Pain in right knee: Secondary | ICD-10-CM | POA: Diagnosis not present

## 2018-06-03 DIAGNOSIS — M25561 Pain in right knee: Secondary | ICD-10-CM | POA: Diagnosis not present

## 2018-06-05 DIAGNOSIS — M25561 Pain in right knee: Secondary | ICD-10-CM | POA: Diagnosis not present

## 2018-06-10 DIAGNOSIS — M25561 Pain in right knee: Secondary | ICD-10-CM | POA: Diagnosis not present

## 2018-06-12 DIAGNOSIS — M25561 Pain in right knee: Secondary | ICD-10-CM | POA: Diagnosis not present

## 2018-06-15 DIAGNOSIS — M1711 Unilateral primary osteoarthritis, right knee: Secondary | ICD-10-CM | POA: Diagnosis not present

## 2018-06-15 DIAGNOSIS — M25561 Pain in right knee: Secondary | ICD-10-CM | POA: Diagnosis not present

## 2018-06-21 DIAGNOSIS — M549 Dorsalgia, unspecified: Secondary | ICD-10-CM | POA: Diagnosis not present

## 2018-07-01 DIAGNOSIS — M549 Dorsalgia, unspecified: Secondary | ICD-10-CM | POA: Diagnosis not present

## 2018-07-01 DIAGNOSIS — M5134 Other intervertebral disc degeneration, thoracic region: Secondary | ICD-10-CM | POA: Diagnosis not present

## 2018-07-01 DIAGNOSIS — M2578 Osteophyte, vertebrae: Secondary | ICD-10-CM | POA: Diagnosis not present

## 2018-07-08 DIAGNOSIS — M25561 Pain in right knee: Secondary | ICD-10-CM | POA: Diagnosis not present

## 2018-07-08 DIAGNOSIS — M546 Pain in thoracic spine: Secondary | ICD-10-CM | POA: Diagnosis not present

## 2018-07-11 DIAGNOSIS — L570 Actinic keratosis: Secondary | ICD-10-CM | POA: Diagnosis not present

## 2018-07-11 DIAGNOSIS — G4733 Obstructive sleep apnea (adult) (pediatric): Secondary | ICD-10-CM | POA: Diagnosis not present

## 2018-07-11 DIAGNOSIS — C44719 Basal cell carcinoma of skin of left lower limb, including hip: Secondary | ICD-10-CM | POA: Diagnosis not present

## 2018-07-11 DIAGNOSIS — L821 Other seborrheic keratosis: Secondary | ICD-10-CM | POA: Diagnosis not present

## 2018-07-11 DIAGNOSIS — L820 Inflamed seborrheic keratosis: Secondary | ICD-10-CM | POA: Diagnosis not present

## 2018-07-15 DIAGNOSIS — R69 Illness, unspecified: Secondary | ICD-10-CM | POA: Diagnosis not present

## 2018-07-22 DIAGNOSIS — K5790 Diverticulosis of intestine, part unspecified, without perforation or abscess without bleeding: Secondary | ICD-10-CM | POA: Diagnosis not present

## 2018-07-22 DIAGNOSIS — E785 Hyperlipidemia, unspecified: Secondary | ICD-10-CM | POA: Diagnosis not present

## 2018-07-22 DIAGNOSIS — J45909 Unspecified asthma, uncomplicated: Secondary | ICD-10-CM | POA: Diagnosis not present

## 2018-07-22 DIAGNOSIS — M8580 Other specified disorders of bone density and structure, unspecified site: Secondary | ICD-10-CM | POA: Diagnosis not present

## 2018-08-07 DIAGNOSIS — M549 Dorsalgia, unspecified: Secondary | ICD-10-CM | POA: Diagnosis not present

## 2018-08-29 DIAGNOSIS — M25561 Pain in right knee: Secondary | ICD-10-CM | POA: Diagnosis not present

## 2018-08-29 DIAGNOSIS — M545 Low back pain: Secondary | ICD-10-CM | POA: Diagnosis not present

## 2018-09-02 DIAGNOSIS — L570 Actinic keratosis: Secondary | ICD-10-CM | POA: Diagnosis not present

## 2018-09-02 DIAGNOSIS — Z08 Encounter for follow-up examination after completed treatment for malignant neoplasm: Secondary | ICD-10-CM | POA: Diagnosis not present

## 2018-09-02 DIAGNOSIS — L603 Nail dystrophy: Secondary | ICD-10-CM | POA: Diagnosis not present

## 2018-09-02 DIAGNOSIS — Z85828 Personal history of other malignant neoplasm of skin: Secondary | ICD-10-CM | POA: Diagnosis not present

## 2018-09-05 DIAGNOSIS — M25561 Pain in right knee: Secondary | ICD-10-CM | POA: Diagnosis not present

## 2018-09-05 DIAGNOSIS — M1711 Unilateral primary osteoarthritis, right knee: Secondary | ICD-10-CM | POA: Diagnosis not present

## 2018-09-09 DIAGNOSIS — T783XXA Angioneurotic edema, initial encounter: Secondary | ICD-10-CM | POA: Diagnosis not present

## 2018-09-09 DIAGNOSIS — J3089 Other allergic rhinitis: Secondary | ICD-10-CM | POA: Diagnosis not present

## 2018-09-09 DIAGNOSIS — G4730 Sleep apnea, unspecified: Secondary | ICD-10-CM | POA: Diagnosis not present

## 2018-09-09 DIAGNOSIS — R05 Cough: Secondary | ICD-10-CM | POA: Diagnosis not present

## 2018-09-09 DIAGNOSIS — K210 Gastro-esophageal reflux disease with esophagitis: Secondary | ICD-10-CM | POA: Diagnosis not present

## 2018-09-09 DIAGNOSIS — G4733 Obstructive sleep apnea (adult) (pediatric): Secondary | ICD-10-CM | POA: Diagnosis not present

## 2018-09-24 DIAGNOSIS — M545 Low back pain: Secondary | ICD-10-CM | POA: Diagnosis not present

## 2018-09-24 DIAGNOSIS — M47817 Spondylosis without myelopathy or radiculopathy, lumbosacral region: Secondary | ICD-10-CM | POA: Diagnosis not present

## 2018-10-06 DIAGNOSIS — S39012A Strain of muscle, fascia and tendon of lower back, initial encounter: Secondary | ICD-10-CM | POA: Diagnosis not present

## 2018-10-06 DIAGNOSIS — Z885 Allergy status to narcotic agent status: Secondary | ICD-10-CM | POA: Diagnosis not present

## 2018-10-06 DIAGNOSIS — X58XXXA Exposure to other specified factors, initial encounter: Secondary | ICD-10-CM | POA: Diagnosis not present

## 2018-10-06 DIAGNOSIS — Z79899 Other long term (current) drug therapy: Secondary | ICD-10-CM | POA: Diagnosis not present

## 2018-10-06 DIAGNOSIS — Z888 Allergy status to other drugs, medicaments and biological substances status: Secondary | ICD-10-CM | POA: Diagnosis not present

## 2018-10-06 DIAGNOSIS — Z882 Allergy status to sulfonamides status: Secondary | ICD-10-CM | POA: Diagnosis not present

## 2018-10-06 DIAGNOSIS — K219 Gastro-esophageal reflux disease without esophagitis: Secondary | ICD-10-CM | POA: Diagnosis not present

## 2018-10-06 DIAGNOSIS — Z881 Allergy status to other antibiotic agents status: Secondary | ICD-10-CM | POA: Diagnosis not present

## 2018-10-06 DIAGNOSIS — R82998 Other abnormal findings in urine: Secondary | ICD-10-CM | POA: Diagnosis not present

## 2018-10-08 DIAGNOSIS — M545 Low back pain: Secondary | ICD-10-CM | POA: Diagnosis not present

## 2018-10-08 DIAGNOSIS — M542 Cervicalgia: Secondary | ICD-10-CM | POA: Diagnosis not present

## 2018-10-11 DIAGNOSIS — M47812 Spondylosis without myelopathy or radiculopathy, cervical region: Secondary | ICD-10-CM | POA: Diagnosis not present

## 2018-10-11 DIAGNOSIS — M5031 Other cervical disc degeneration,  high cervical region: Secondary | ICD-10-CM | POA: Diagnosis not present

## 2018-10-11 DIAGNOSIS — M542 Cervicalgia: Secondary | ICD-10-CM | POA: Diagnosis not present

## 2018-10-11 DIAGNOSIS — M4802 Spinal stenosis, cervical region: Secondary | ICD-10-CM | POA: Diagnosis not present

## 2018-10-22 DIAGNOSIS — M545 Low back pain: Secondary | ICD-10-CM | POA: Diagnosis not present

## 2018-10-29 DIAGNOSIS — R69 Illness, unspecified: Secondary | ICD-10-CM | POA: Diagnosis not present

## 2018-10-31 DIAGNOSIS — R69 Illness, unspecified: Secondary | ICD-10-CM | POA: Diagnosis not present

## 2018-11-06 DIAGNOSIS — Z03818 Encounter for observation for suspected exposure to other biological agents ruled out: Secondary | ICD-10-CM | POA: Diagnosis not present

## 2018-11-19 DIAGNOSIS — M545 Low back pain: Secondary | ICD-10-CM | POA: Diagnosis not present

## 2018-11-27 DIAGNOSIS — J310 Chronic rhinitis: Secondary | ICD-10-CM | POA: Diagnosis not present

## 2018-11-27 DIAGNOSIS — G4730 Sleep apnea, unspecified: Secondary | ICD-10-CM | POA: Diagnosis not present

## 2018-11-27 DIAGNOSIS — K210 Gastro-esophageal reflux disease with esophagitis: Secondary | ICD-10-CM | POA: Diagnosis not present

## 2018-11-27 DIAGNOSIS — J019 Acute sinusitis, unspecified: Secondary | ICD-10-CM | POA: Diagnosis not present

## 2018-11-27 DIAGNOSIS — T783XXA Angioneurotic edema, initial encounter: Secondary | ICD-10-CM | POA: Diagnosis not present

## 2018-11-29 DIAGNOSIS — M47817 Spondylosis without myelopathy or radiculopathy, lumbosacral region: Secondary | ICD-10-CM | POA: Diagnosis not present

## 2018-12-05 DIAGNOSIS — R69 Illness, unspecified: Secondary | ICD-10-CM | POA: Diagnosis not present

## 2018-12-12 DIAGNOSIS — M542 Cervicalgia: Secondary | ICD-10-CM | POA: Diagnosis not present

## 2018-12-12 DIAGNOSIS — G3184 Mild cognitive impairment, so stated: Secondary | ICD-10-CM | POA: Diagnosis not present

## 2018-12-12 DIAGNOSIS — M797 Fibromyalgia: Secondary | ICD-10-CM | POA: Diagnosis not present

## 2018-12-12 DIAGNOSIS — G5603 Carpal tunnel syndrome, bilateral upper limbs: Secondary | ICD-10-CM | POA: Diagnosis not present

## 2018-12-12 DIAGNOSIS — R5383 Other fatigue: Secondary | ICD-10-CM | POA: Diagnosis not present

## 2018-12-17 DIAGNOSIS — M5412 Radiculopathy, cervical region: Secondary | ICD-10-CM | POA: Diagnosis not present

## 2018-12-17 DIAGNOSIS — M4722 Other spondylosis with radiculopathy, cervical region: Secondary | ICD-10-CM | POA: Diagnosis not present

## 2018-12-25 DIAGNOSIS — M25561 Pain in right knee: Secondary | ICD-10-CM | POA: Diagnosis not present

## 2018-12-25 DIAGNOSIS — M1711 Unilateral primary osteoarthritis, right knee: Secondary | ICD-10-CM | POA: Diagnosis not present

## 2018-12-26 DIAGNOSIS — M542 Cervicalgia: Secondary | ICD-10-CM | POA: Diagnosis not present

## 2018-12-26 DIAGNOSIS — G8929 Other chronic pain: Secondary | ICD-10-CM | POA: Diagnosis not present

## 2018-12-31 DIAGNOSIS — K581 Irritable bowel syndrome with constipation: Secondary | ICD-10-CM | POA: Diagnosis not present

## 2018-12-31 DIAGNOSIS — K219 Gastro-esophageal reflux disease without esophagitis: Secondary | ICD-10-CM | POA: Diagnosis not present

## 2019-01-07 DIAGNOSIS — M542 Cervicalgia: Secondary | ICD-10-CM | POA: Diagnosis not present

## 2019-01-07 DIAGNOSIS — M79601 Pain in right arm: Secondary | ICD-10-CM | POA: Diagnosis not present

## 2019-01-07 DIAGNOSIS — M791 Myalgia, unspecified site: Secondary | ICD-10-CM | POA: Diagnosis not present

## 2019-01-07 DIAGNOSIS — M5481 Occipital neuralgia: Secondary | ICD-10-CM | POA: Diagnosis not present

## 2019-01-07 DIAGNOSIS — G894 Chronic pain syndrome: Secondary | ICD-10-CM | POA: Diagnosis not present

## 2019-01-13 DIAGNOSIS — R69 Illness, unspecified: Secondary | ICD-10-CM | POA: Diagnosis not present

## 2019-01-22 DIAGNOSIS — R69 Illness, unspecified: Secondary | ICD-10-CM | POA: Diagnosis not present

## 2019-01-28 DIAGNOSIS — C44719 Basal cell carcinoma of skin of left lower limb, including hip: Secondary | ICD-10-CM | POA: Diagnosis not present

## 2019-01-29 DIAGNOSIS — K581 Irritable bowel syndrome with constipation: Secondary | ICD-10-CM | POA: Diagnosis not present

## 2019-01-30 DIAGNOSIS — M545 Low back pain: Secondary | ICD-10-CM | POA: Diagnosis not present

## 2019-01-31 DIAGNOSIS — R5383 Other fatigue: Secondary | ICD-10-CM | POA: Diagnosis not present

## 2019-01-31 DIAGNOSIS — M8580 Other specified disorders of bone density and structure, unspecified site: Secondary | ICD-10-CM | POA: Diagnosis not present

## 2019-01-31 DIAGNOSIS — J309 Allergic rhinitis, unspecified: Secondary | ICD-10-CM | POA: Diagnosis not present

## 2019-01-31 DIAGNOSIS — Z0000 Encounter for general adult medical examination without abnormal findings: Secondary | ICD-10-CM | POA: Diagnosis not present

## 2019-01-31 DIAGNOSIS — E785 Hyperlipidemia, unspecified: Secondary | ICD-10-CM | POA: Diagnosis not present

## 2019-01-31 DIAGNOSIS — Z01419 Encounter for gynecological examination (general) (routine) without abnormal findings: Secondary | ICD-10-CM | POA: Diagnosis not present

## 2019-01-31 DIAGNOSIS — D473 Essential (hemorrhagic) thrombocythemia: Secondary | ICD-10-CM | POA: Diagnosis not present

## 2019-01-31 DIAGNOSIS — J45909 Unspecified asthma, uncomplicated: Secondary | ICD-10-CM | POA: Diagnosis not present

## 2019-01-31 DIAGNOSIS — M899 Disorder of bone, unspecified: Secondary | ICD-10-CM | POA: Diagnosis not present

## 2019-02-07 DIAGNOSIS — M5412 Radiculopathy, cervical region: Secondary | ICD-10-CM | POA: Insufficient documentation

## 2019-02-10 DIAGNOSIS — D473 Essential (hemorrhagic) thrombocythemia: Secondary | ICD-10-CM | POA: Diagnosis not present

## 2019-02-21 DIAGNOSIS — M5412 Radiculopathy, cervical region: Secondary | ICD-10-CM | POA: Diagnosis not present

## 2019-03-07 DIAGNOSIS — M47817 Spondylosis without myelopathy or radiculopathy, lumbosacral region: Secondary | ICD-10-CM | POA: Diagnosis not present

## 2019-03-12 DIAGNOSIS — D75839 Thrombocytosis, unspecified: Secondary | ICD-10-CM | POA: Insufficient documentation

## 2019-03-12 DIAGNOSIS — Z9989 Dependence on other enabling machines and devices: Secondary | ICD-10-CM | POA: Diagnosis not present

## 2019-03-12 DIAGNOSIS — G4730 Sleep apnea, unspecified: Secondary | ICD-10-CM | POA: Diagnosis not present

## 2019-03-12 DIAGNOSIS — D473 Essential (hemorrhagic) thrombocythemia: Secondary | ICD-10-CM | POA: Diagnosis not present

## 2019-03-12 DIAGNOSIS — Z79899 Other long term (current) drug therapy: Secondary | ICD-10-CM | POA: Diagnosis not present

## 2019-03-12 DIAGNOSIS — M1990 Unspecified osteoarthritis, unspecified site: Secondary | ICD-10-CM | POA: Diagnosis not present

## 2019-03-12 DIAGNOSIS — R69 Illness, unspecified: Secondary | ICD-10-CM | POA: Diagnosis not present

## 2019-03-12 DIAGNOSIS — K5900 Constipation, unspecified: Secondary | ICD-10-CM | POA: Diagnosis not present

## 2019-03-12 DIAGNOSIS — M797 Fibromyalgia: Secondary | ICD-10-CM | POA: Diagnosis not present

## 2019-03-17 DIAGNOSIS — M542 Cervicalgia: Secondary | ICD-10-CM | POA: Diagnosis not present

## 2019-03-17 DIAGNOSIS — M791 Myalgia, unspecified site: Secondary | ICD-10-CM | POA: Diagnosis not present

## 2019-03-17 DIAGNOSIS — M79601 Pain in right arm: Secondary | ICD-10-CM | POA: Diagnosis not present

## 2019-03-17 DIAGNOSIS — G894 Chronic pain syndrome: Secondary | ICD-10-CM | POA: Diagnosis not present

## 2019-03-17 DIAGNOSIS — M5481 Occipital neuralgia: Secondary | ICD-10-CM | POA: Diagnosis not present

## 2019-03-19 DIAGNOSIS — M85851 Other specified disorders of bone density and structure, right thigh: Secondary | ICD-10-CM | POA: Diagnosis not present

## 2019-03-19 DIAGNOSIS — Z13820 Encounter for screening for osteoporosis: Secondary | ICD-10-CM | POA: Diagnosis not present

## 2019-03-19 DIAGNOSIS — Z1231 Encounter for screening mammogram for malignant neoplasm of breast: Secondary | ICD-10-CM | POA: Diagnosis not present

## 2019-03-19 DIAGNOSIS — Z780 Asymptomatic menopausal state: Secondary | ICD-10-CM | POA: Diagnosis not present

## 2019-03-20 DIAGNOSIS — M1711 Unilateral primary osteoarthritis, right knee: Secondary | ICD-10-CM | POA: Diagnosis not present

## 2019-03-20 DIAGNOSIS — M25561 Pain in right knee: Secondary | ICD-10-CM | POA: Diagnosis not present

## 2019-04-03 DIAGNOSIS — M25552 Pain in left hip: Secondary | ICD-10-CM | POA: Diagnosis not present

## 2019-04-03 DIAGNOSIS — M791 Myalgia, unspecified site: Secondary | ICD-10-CM | POA: Diagnosis not present

## 2019-04-03 DIAGNOSIS — G894 Chronic pain syndrome: Secondary | ICD-10-CM | POA: Diagnosis not present

## 2019-04-09 DIAGNOSIS — M7062 Trochanteric bursitis, left hip: Secondary | ICD-10-CM | POA: Diagnosis not present

## 2019-04-09 DIAGNOSIS — M25552 Pain in left hip: Secondary | ICD-10-CM | POA: Diagnosis not present

## 2019-04-16 DIAGNOSIS — M25559 Pain in unspecified hip: Secondary | ICD-10-CM | POA: Diagnosis not present

## 2019-04-16 DIAGNOSIS — M25552 Pain in left hip: Secondary | ICD-10-CM | POA: Diagnosis not present

## 2019-04-18 DIAGNOSIS — D7589 Other specified diseases of blood and blood-forming organs: Secondary | ICD-10-CM | POA: Diagnosis not present

## 2019-04-18 DIAGNOSIS — D473 Essential (hemorrhagic) thrombocythemia: Secondary | ICD-10-CM | POA: Diagnosis not present

## 2019-04-18 DIAGNOSIS — M797 Fibromyalgia: Secondary | ICD-10-CM | POA: Diagnosis not present

## 2019-04-18 DIAGNOSIS — R69 Illness, unspecified: Secondary | ICD-10-CM | POA: Diagnosis not present

## 2019-04-18 DIAGNOSIS — R739 Hyperglycemia, unspecified: Secondary | ICD-10-CM | POA: Diagnosis not present

## 2019-04-18 DIAGNOSIS — K219 Gastro-esophageal reflux disease without esophagitis: Secondary | ICD-10-CM | POA: Diagnosis not present

## 2019-04-18 DIAGNOSIS — K5900 Constipation, unspecified: Secondary | ICD-10-CM | POA: Diagnosis not present

## 2019-05-06 DIAGNOSIS — L570 Actinic keratosis: Secondary | ICD-10-CM | POA: Diagnosis not present

## 2019-05-06 DIAGNOSIS — L304 Erythema intertrigo: Secondary | ICD-10-CM | POA: Diagnosis not present

## 2019-05-06 DIAGNOSIS — C44719 Basal cell carcinoma of skin of left lower limb, including hip: Secondary | ICD-10-CM | POA: Diagnosis not present

## 2019-05-07 DIAGNOSIS — K219 Gastro-esophageal reflux disease without esophagitis: Secondary | ICD-10-CM | POA: Diagnosis not present

## 2019-05-07 DIAGNOSIS — R739 Hyperglycemia, unspecified: Secondary | ICD-10-CM | POA: Diagnosis not present

## 2019-05-07 DIAGNOSIS — R7309 Other abnormal glucose: Secondary | ICD-10-CM | POA: Diagnosis not present

## 2019-05-07 DIAGNOSIS — D473 Essential (hemorrhagic) thrombocythemia: Secondary | ICD-10-CM | POA: Diagnosis not present

## 2019-05-07 DIAGNOSIS — R69 Illness, unspecified: Secondary | ICD-10-CM | POA: Diagnosis not present

## 2019-05-07 DIAGNOSIS — D7589 Other specified diseases of blood and blood-forming organs: Secondary | ICD-10-CM | POA: Diagnosis not present

## 2019-05-16 ENCOUNTER — Encounter (INDEPENDENT_AMBULATORY_CARE_PROVIDER_SITE_OTHER): Payer: Self-pay

## 2019-05-19 DIAGNOSIS — Z01818 Encounter for other preprocedural examination: Secondary | ICD-10-CM | POA: Diagnosis not present

## 2019-05-20 DIAGNOSIS — R69 Illness, unspecified: Secondary | ICD-10-CM | POA: Diagnosis not present

## 2019-05-22 DIAGNOSIS — R69 Illness, unspecified: Secondary | ICD-10-CM | POA: Diagnosis not present

## 2019-05-23 DIAGNOSIS — R69 Illness, unspecified: Secondary | ICD-10-CM | POA: Diagnosis not present

## 2019-05-23 DIAGNOSIS — M797 Fibromyalgia: Secondary | ICD-10-CM | POA: Diagnosis not present

## 2019-05-23 DIAGNOSIS — D473 Essential (hemorrhagic) thrombocythemia: Secondary | ICD-10-CM | POA: Diagnosis not present

## 2019-05-23 DIAGNOSIS — Z79899 Other long term (current) drug therapy: Secondary | ICD-10-CM | POA: Diagnosis not present

## 2019-05-23 DIAGNOSIS — K5900 Constipation, unspecified: Secondary | ICD-10-CM | POA: Diagnosis not present

## 2019-05-23 DIAGNOSIS — K589 Irritable bowel syndrome without diarrhea: Secondary | ICD-10-CM | POA: Diagnosis not present

## 2019-05-23 DIAGNOSIS — K219 Gastro-esophageal reflux disease without esophagitis: Secondary | ICD-10-CM | POA: Diagnosis not present

## 2019-05-23 DIAGNOSIS — K581 Irritable bowel syndrome with constipation: Secondary | ICD-10-CM | POA: Diagnosis not present

## 2019-05-23 DIAGNOSIS — D7589 Other specified diseases of blood and blood-forming organs: Secondary | ICD-10-CM | POA: Diagnosis not present

## 2019-06-10 DIAGNOSIS — M791 Myalgia, unspecified site: Secondary | ICD-10-CM | POA: Diagnosis not present

## 2019-06-17 DIAGNOSIS — Z0100 Encounter for examination of eyes and vision without abnormal findings: Secondary | ICD-10-CM | POA: Diagnosis not present

## 2019-06-17 DIAGNOSIS — H43812 Vitreous degeneration, left eye: Secondary | ICD-10-CM | POA: Diagnosis not present

## 2019-06-17 DIAGNOSIS — H04123 Dry eye syndrome of bilateral lacrimal glands: Secondary | ICD-10-CM | POA: Diagnosis not present

## 2019-06-17 DIAGNOSIS — Z135 Encounter for screening for eye and ear disorders: Secondary | ICD-10-CM | POA: Diagnosis not present

## 2019-06-17 DIAGNOSIS — E7800 Pure hypercholesterolemia, unspecified: Secondary | ICD-10-CM | POA: Diagnosis not present

## 2019-07-10 DIAGNOSIS — J984 Other disorders of lung: Secondary | ICD-10-CM | POA: Diagnosis not present

## 2019-07-24 DIAGNOSIS — M791 Myalgia, unspecified site: Secondary | ICD-10-CM | POA: Diagnosis not present

## 2019-07-24 DIAGNOSIS — G894 Chronic pain syndrome: Secondary | ICD-10-CM | POA: Diagnosis not present

## 2019-07-28 DIAGNOSIS — M25561 Pain in right knee: Secondary | ICD-10-CM | POA: Diagnosis not present

## 2019-07-31 DIAGNOSIS — E785 Hyperlipidemia, unspecified: Secondary | ICD-10-CM | POA: Diagnosis not present

## 2019-08-06 DIAGNOSIS — R69 Illness, unspecified: Secondary | ICD-10-CM | POA: Diagnosis not present

## 2019-08-12 DIAGNOSIS — R69 Illness, unspecified: Secondary | ICD-10-CM | POA: Diagnosis not present

## 2019-08-26 DIAGNOSIS — Z9989 Dependence on other enabling machines and devices: Secondary | ICD-10-CM | POA: Diagnosis not present

## 2019-08-26 DIAGNOSIS — G4733 Obstructive sleep apnea (adult) (pediatric): Secondary | ICD-10-CM | POA: Diagnosis not present

## 2019-08-28 DIAGNOSIS — L710 Perioral dermatitis: Secondary | ICD-10-CM | POA: Diagnosis not present

## 2019-09-01 DIAGNOSIS — G894 Chronic pain syndrome: Secondary | ICD-10-CM | POA: Diagnosis not present

## 2019-09-01 DIAGNOSIS — M791 Myalgia, unspecified site: Secondary | ICD-10-CM | POA: Diagnosis not present

## 2019-09-01 DIAGNOSIS — M542 Cervicalgia: Secondary | ICD-10-CM | POA: Diagnosis not present

## 2019-09-26 DIAGNOSIS — M25561 Pain in right knee: Secondary | ICD-10-CM | POA: Diagnosis not present

## 2019-09-26 DIAGNOSIS — M1711 Unilateral primary osteoarthritis, right knee: Secondary | ICD-10-CM | POA: Diagnosis not present

## 2019-10-14 DIAGNOSIS — G894 Chronic pain syndrome: Secondary | ICD-10-CM | POA: Diagnosis not present

## 2019-10-14 DIAGNOSIS — M791 Myalgia, unspecified site: Secondary | ICD-10-CM | POA: Diagnosis not present

## 2019-10-30 DIAGNOSIS — N950 Postmenopausal bleeding: Secondary | ICD-10-CM | POA: Diagnosis not present

## 2019-12-16 DIAGNOSIS — L821 Other seborrheic keratosis: Secondary | ICD-10-CM | POA: Diagnosis not present

## 2019-12-16 DIAGNOSIS — L570 Actinic keratosis: Secondary | ICD-10-CM | POA: Diagnosis not present

## 2019-12-16 DIAGNOSIS — Z85828 Personal history of other malignant neoplasm of skin: Secondary | ICD-10-CM | POA: Diagnosis not present

## 2019-12-16 DIAGNOSIS — L603 Nail dystrophy: Secondary | ICD-10-CM | POA: Diagnosis not present

## 2019-12-16 DIAGNOSIS — D1801 Hemangioma of skin and subcutaneous tissue: Secondary | ICD-10-CM | POA: Diagnosis not present

## 2019-12-30 DIAGNOSIS — N950 Postmenopausal bleeding: Secondary | ICD-10-CM | POA: Diagnosis not present

## 2020-01-16 DIAGNOSIS — M1711 Unilateral primary osteoarthritis, right knee: Secondary | ICD-10-CM | POA: Diagnosis not present

## 2020-01-29 DIAGNOSIS — N950 Postmenopausal bleeding: Secondary | ICD-10-CM | POA: Diagnosis not present

## 2020-02-02 DIAGNOSIS — M25531 Pain in right wrist: Secondary | ICD-10-CM | POA: Diagnosis not present

## 2020-02-06 DIAGNOSIS — M899 Disorder of bone, unspecified: Secondary | ICD-10-CM | POA: Diagnosis not present

## 2020-02-06 DIAGNOSIS — R69 Illness, unspecified: Secondary | ICD-10-CM | POA: Diagnosis not present

## 2020-02-06 DIAGNOSIS — E785 Hyperlipidemia, unspecified: Secondary | ICD-10-CM | POA: Diagnosis not present

## 2020-02-06 DIAGNOSIS — M8580 Other specified disorders of bone density and structure, unspecified site: Secondary | ICD-10-CM | POA: Diagnosis not present

## 2020-02-06 DIAGNOSIS — D473 Essential (hemorrhagic) thrombocythemia: Secondary | ICD-10-CM | POA: Diagnosis not present

## 2020-02-06 DIAGNOSIS — Z23 Encounter for immunization: Secondary | ICD-10-CM | POA: Diagnosis not present

## 2020-02-06 DIAGNOSIS — Z0000 Encounter for general adult medical examination without abnormal findings: Secondary | ICD-10-CM | POA: Diagnosis not present

## 2020-02-06 DIAGNOSIS — M170 Bilateral primary osteoarthritis of knee: Secondary | ICD-10-CM | POA: Diagnosis not present

## 2020-02-06 DIAGNOSIS — R5383 Other fatigue: Secondary | ICD-10-CM | POA: Diagnosis not present

## 2020-02-11 DIAGNOSIS — G8929 Other chronic pain: Secondary | ICD-10-CM | POA: Diagnosis not present

## 2020-02-11 DIAGNOSIS — M542 Cervicalgia: Secondary | ICD-10-CM | POA: Diagnosis not present

## 2020-02-11 DIAGNOSIS — M25531 Pain in right wrist: Secondary | ICD-10-CM | POA: Diagnosis not present

## 2020-02-16 DIAGNOSIS — R69 Illness, unspecified: Secondary | ICD-10-CM | POA: Diagnosis not present

## 2020-03-02 DIAGNOSIS — G894 Chronic pain syndrome: Secondary | ICD-10-CM | POA: Diagnosis not present

## 2020-03-02 DIAGNOSIS — M542 Cervicalgia: Secondary | ICD-10-CM | POA: Diagnosis not present

## 2020-03-02 DIAGNOSIS — M791 Myalgia, unspecified site: Secondary | ICD-10-CM | POA: Diagnosis not present

## 2020-03-24 DIAGNOSIS — J019 Acute sinusitis, unspecified: Secondary | ICD-10-CM | POA: Diagnosis not present

## 2020-03-24 DIAGNOSIS — B9689 Other specified bacterial agents as the cause of diseases classified elsewhere: Secondary | ICD-10-CM | POA: Diagnosis not present

## 2020-03-30 DIAGNOSIS — Z1231 Encounter for screening mammogram for malignant neoplasm of breast: Secondary | ICD-10-CM | POA: Diagnosis not present

## 2020-04-01 DIAGNOSIS — M545 Low back pain, unspecified: Secondary | ICD-10-CM | POA: Diagnosis not present

## 2020-04-01 DIAGNOSIS — G3184 Mild cognitive impairment, so stated: Secondary | ICD-10-CM | POA: Diagnosis not present

## 2020-04-01 DIAGNOSIS — M542 Cervicalgia: Secondary | ICD-10-CM | POA: Diagnosis not present

## 2020-04-28 ENCOUNTER — Emergency Department (INDEPENDENT_AMBULATORY_CARE_PROVIDER_SITE_OTHER): Payer: Medicare HMO

## 2020-04-28 ENCOUNTER — Other Ambulatory Visit: Payer: Self-pay

## 2020-04-28 ENCOUNTER — Emergency Department
Admission: EM | Admit: 2020-04-28 | Discharge: 2020-04-28 | Disposition: A | Discharge status: Home / Self Care | Payer: Medicare HMO | Source: Home / Self Care

## 2020-04-28 DIAGNOSIS — J9811 Atelectasis: Secondary | ICD-10-CM | POA: Diagnosis not present

## 2020-04-28 DIAGNOSIS — J069 Acute upper respiratory infection, unspecified: Secondary | ICD-10-CM

## 2020-04-28 DIAGNOSIS — J90 Pleural effusion, not elsewhere classified: Secondary | ICD-10-CM | POA: Diagnosis not present

## 2020-04-28 DIAGNOSIS — R059 Cough, unspecified: Secondary | ICD-10-CM | POA: Diagnosis not present

## 2020-04-28 MED ORDER — DEXAMETHASONE 4 MG PO TABS: 4.0000 mg | ORAL_TABLET | Freq: Two times a day (BID) | ORAL | 0 refills | Status: DC

## 2020-04-28 MED ORDER — BENZONATATE 100 MG PO CAPS: 100.0000 mg | ORAL_CAPSULE | Freq: Three times a day (TID) | ORAL | 0 refills | Status: DC | PRN

## 2020-04-28 NOTE — Discharge Instructions (Signed)
This appears to be a viral infection.  I believe that this was probably caused by Covid.  At this point we are going to give you medicine to open up your lungs and stop the cough.

## 2020-04-28 NOTE — ED Triage Notes (Signed)
Pt c/o cough and congestion since before thanksgiving. Has been on antibiotic. Uses nasal spray prn. Sees allergist but hasnt been able to get into. Uses inhaler prn. No known fever. No known covid exposure. Has had all three covid vaccines.

## 2020-04-28 NOTE — ED Provider Notes (Signed)
[facility]    CSN: [id number] Arrival date & time: 04/28/20  1124      History   Chief Complaint Chief Complaint  Patient presents with  . Cough  . Nasal Congestion    HPI Anna Tanner is a 69 y.o. female.   Initial [facility] urgent care patient visit for this 69 year old woman with cough  Pt c/o cough and congestion since before thanksgiving. Has been on antibiotic. Uses nasal spray prn. Sees allergist but hasnt been able to get into. Uses inhaler prn. No known fever. No known covid exposure. Has had all three covid vaccines.  Patient's doctor will not see her until February     Past Medical History:  Diagnosis Date  . Asthma   . Constipation   . Sinusitis 03/02/11   affects all sinuses    Patient Active Problem List   Diagnosis Date Noted  . Bipolar I disorder, most recent episode (or current) mixed, unspecified     Past Surgical History:  Procedure Laterality Date  . 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 MULTIPLE TOOTH EXTRACTIONS  11/20/2011   Two teeth.    OB History   No obstetric history on file.      Home Medications    Prior to Admission medications   Medication Sig Start Date End Date Taking? Authorizing Provider  alendronate (FOSAMAX) 70 MG tablet Take 70 mg by mouth once a week. Take with a full glass of water on an empty stomach.   Yes [provider]  benzonatate (TESSALON) 100 MG capsule Take 1-2 capsules (100-200 mg total) by mouth 3 (three) times daily as needed for cough. 04/28/20  Yes Robyn Haber, MD  buPROPion (WELLBUTRIN XL) 300 MG 24 hr tablet Take 300 mg by mouth daily.   Yes [provider]  dexamethasone (DECADRON) 4 MG tablet Take 1 tablet (4 mg total) by mouth 2 (two) times daily with a meal. 04/28/20  Yes Robyn Haber, MD  dexlansoprazole (DEXILANT) 60 MG capsule Take 60 mg by mouth daily.   Yes [provider]  linaclotide (LINZESS) 290 MCG CAPS capsule  Take 290 mcg by mouth daily before breakfast.   Yes [provider]  Milnacipran HCl (SAVELLA) 100 MG TABS tablet Take by mouth 2 (two) times daily.   Yes [provider]  azelastine (ASTELIN) 137 MCG/SPRAY nasal spray  01/08/12   [provider]  calcium carbonate (OS-CAL) 600 MG TABS Take 600 mg by mouth 2 (two) times daily with a meal.    [provider]  fexofenadine (ALLEGRA) 180 MG tablet Take 180 mg by mouth daily.    [provider]  ipratropium (ATROVENT) 0.06 % nasal spray Place 1 spray into the nose as needed. 07/25/12   [provider]  lovastatin (MEVACOR) 20 MG tablet Take 20 mg by mouth once. 05/29/11   [provider]  montelukast (SINGULAIR) 10 MG tablet Take 10 mg by mouth daily. 12/02/12   [provider]  PROAIR HFA 108 (90 BASE) MCG/ACT inhaler Inhale 2 puffs into the lungs as needed. 05/06/12   [provider]  QUEtiapine (SEROQUEL) 300 MG tablet Take 1 tablet (300 mg total) by mouth 2 (two) times daily. Take one half (150mg ) each morning and each evening. Patient taking differently: Take 300 mg by mouth at bedtime. Take one half (150mg ) each morning and each evening. 10/16/13   Ruben Im, PA-C  trazodone (DESYREL) 300 MG tablet Take  1 tablet (300 mg total) by mouth at bedtime. 05/05/13   Puthuvel, Iven Finn, MD  esomeprazole (NEXIUM) 40 MG capsule Take 40 mg by mouth daily before breakfast.  04/28/20  [provider]  methylphenidate (RITALIN) 10 MG tablet  05/15/11 07/14/11  [provider]    Family History Family History  Problem Relation Age of Onset  . Bipolar disorder Father   . Suicidality Paternal Aunt   . Depression Paternal Aunt   . Suicidality Maternal Uncle   . Bipolar disorder Maternal Grandmother   . Bipolar disorder Paternal Grandfather   . Bipolar disorder Brother   . Diabetes Mellitus II Brother   . Bipolar disorder Brother   . Heart attack Brother   . CAD  Brother   . Drug abuse Brother   . Diabetes Mellitus II Mother   . Bipolar disorder Cousin   . Bipolar disorder Cousin   . Bipolar disorder Cousin     Social History Social History   Tobacco Use  . Smoking status: Never Smoker  . Smokeless tobacco: Never Used  Substance Use Topics  . Alcohol use: No  . Drug use: No     Allergies   Amoxicillin, Butazolidin [phenylbutazone], Lidocaine, Morphine and related, Nsaids, Pseudoephedrine, and Sulfa antibiotics   Review of Systems Review of Systems  Constitutional: Positive for fatigue. Negative for fever.  Respiratory: Positive for cough, shortness of breath and wheezing.      Physical Exam Triage Vital Signs ED Triage Vitals [04/28/20 1202]  Enc Vitals Group     BP 113/78     Pulse Rate (!) 101     Resp 17     Temp 98.3 F (36.8 C)     Temp Source Oral     SpO2 97 %     Weight      Height      Head Circumference      Peak Flow      Pain Score 0     Pain Loc      Pain Edu?      Excl. in GC?    No data found.  Updated Vital Signs BP 113/78 (BP Location: Right Arm)   Pulse (!) 101   Temp 98.3 F (36.8 C) (Oral)   Resp 17   SpO2 97%    Physical Exam Vitals and nursing note reviewed.  Constitutional:      Appearance: Normal appearance. She is normal weight.  HENT:     Head: Normocephalic.     Mouth/Throat:     Mouth: Mucous membranes are moist.     Pharynx: Oropharynx is clear.  Eyes:     Conjunctiva/sclera: Conjunctivae normal.  Cardiovascular:     Rate and Rhythm: Regular rhythm. Tachycardia present.     Pulses: Normal pulses.     Heart sounds: Normal heart sounds.  Pulmonary:     Effort: Pulmonary effort is normal.     Breath sounds: Rales present.     Comments: Inspiratory crackles anteriorly, right worse than left Musculoskeletal:        General: Normal range of motion.     Cervical back: Normal range of motion and neck supple.  Skin:    General: Skin is warm and dry.  Neurological:      General: No focal deficit present.     Mental Status: She is alert.  Psychiatric:        Mood and Affect: Mood normal.      UC Treatments /  Results  Labs (all labs ordered are listed, but only abnormal results are displayed) Labs Reviewed - No data to display  EKG   Radiology DG Chest 2 View  Result Date: 04/28/2020 CLINICAL DATA:  6 weeks of coughing despite antibiotics EXAM: CHEST - 2 VIEW COMPARISON:  None. FINDINGS: Patchy left basilar opacities. Peribronchial cuffing. No pneumothorax or pleural effusion. Left upper lung calcified granuloma. Cardiomediastinal silhouette within normal limits. Multilevel spondylosis. IMPRESSION: Mild bronchitic changes. Patchy left basilar opacities, atelectasis versus infiltrate. Electronically Signed   By: Primitivo Gauze M.D.   On: 04/28/2020 12:50    Procedures Procedures (including critical care time)  Medications Ordered in UC Medications - No data to display  Initial Impression / Assessment and Plan / UC Course  I have reviewed the triage vital signs and the nursing notes.  Pertinent labs & imaging results that were available during my care of the patient were reviewed by me and considered in my medical decision making (see chart for details).    Final Clinical Impressions(s) / UC Diagnoses   Final diagnoses:  Viral upper respiratory tract infection     Discharge Instructions     This appears to be a viral infection.  I believe that this was probably caused by Covid.  At this point we are going to give you medicine to open up your lungs and stop the cough.    ED Prescriptions    Medication Sig Dispense Auth. Provider   dexamethasone (DECADRON) 4 MG tablet Take 1 tablet (4 mg total) by mouth 2 (two) times daily with a meal. 10 tablet Robyn Haber, MD   benzonatate (TESSALON) 100 MG capsule Take 1-2 capsules (100-200 mg total) by mouth 3 (three) times daily as needed for cough. 40 capsule Robyn Haber, MD      PDMP not reviewed this encounter.   Robyn Haber, MD 04/28/20 [phone]

## 2020-05-05 ENCOUNTER — Other Ambulatory Visit: Payer: Self-pay

## 2020-05-05 ENCOUNTER — Emergency Department (INDEPENDENT_AMBULATORY_CARE_PROVIDER_SITE_OTHER)
Admission: EM | Admit: 2020-05-05 | Discharge: 2020-05-05 | Disposition: A | Discharge status: Home / Self Care | Payer: Medicare HMO | Source: Home / Self Care

## 2020-05-05 ENCOUNTER — Emergency Department (INDEPENDENT_AMBULATORY_CARE_PROVIDER_SITE_OTHER): Payer: Medicare HMO

## 2020-05-05 DIAGNOSIS — R911 Solitary pulmonary nodule: Secondary | ICD-10-CM

## 2020-05-05 DIAGNOSIS — J22 Unspecified acute lower respiratory infection: Secondary | ICD-10-CM

## 2020-05-05 DIAGNOSIS — R059 Cough, unspecified: Secondary | ICD-10-CM | POA: Diagnosis not present

## 2020-05-05 MED ORDER — DOXYCYCLINE HYCLATE 100 MG PO CAPS: 100.0000 mg | ORAL_CAPSULE | Freq: Two times a day (BID) | ORAL | 0 refills | Status: DC

## 2020-05-05 MED ORDER — PROMETHAZINE-DM 6.25-15 MG/5ML PO SYRP: 5.0000 mL | ORAL_SOLUTION | Freq: Four times a day (QID) | ORAL | 0 refills | Status: DC | PRN

## 2020-05-05 NOTE — ED Triage Notes (Signed)
Pt returning today after UC visit for continued cough and congestion. Coughing up yellow phlegm. Also states ears started feeling stopped up yesterday.

## 2020-05-05 NOTE — ED Provider Notes (Signed)
[facility]    CSN: [id number] Arrival date & time: 05/05/20  0934      History   Chief Complaint Chief Complaint  Patient presents with  . Cough    Congestion     HPI Anna Tanner is a 70 y.o. female.   HPI  Patient presents today with worsening cough, congestion with wheezing.  Patient was seen here in clinic on 04/28/2020.  During that visit she was not tested for COVID she was treated for a viral upper respiratory illness.  She reports she completed all medication and has since developed worsening symptoms.  She endorses some severe fatigue with mild shortness of breath.  Her vital signs are stable with the exception of being mildly tachycardic.  She denies any known exposure to anyone who has tested positive for COVID.  Her spouse is also ill with less severe although similar symptoms.  Patient has a history of asthma and chronic sinusitis.  Past Medical History:  Diagnosis Date  . Asthma   . Constipation   . Sinusitis 03/02/11   affects all sinuses    Patient Active Problem List   Diagnosis Date Noted  . Bipolar I disorder, most recent episode (or current) mixed, unspecified     Past Surgical History:  Procedure Laterality Date  . 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 MULTIPLE TOOTH EXTRACTIONS  11/20/2011   Two teeth.    OB History   No obstetric history on file.      Home Medications    Prior to Admission medications   Medication Sig Start Date End Date Taking? Authorizing Provider  alendronate (FOSAMAX) 70 MG tablet Take 70 mg by mouth once a week. Take with a full glass of water on an empty stomach.    [provider]  azelastine (ASTELIN) 137 MCG/SPRAY nasal spray  01/08/12   [provider]  benzonatate (TESSALON) 100 MG capsule Take 1-2 capsules (100-200 mg total) by mouth 3 (three) times daily as needed for cough. 04/28/20   Elvina Sidle, MD  buPROPion (WELLBUTRIN XL) 300 MG 24 hr tablet Take 300 mg  by mouth daily.    [provider]  calcium carbonate (OS-CAL) 600 MG TABS Take 600 mg by mouth 2 (two) times daily with a meal.    [provider]  dexamethasone (DECADRON) 4 MG tablet Take 1 tablet (4 mg total) by mouth 2 (two) times daily with a meal. 04/28/20   Elvina Sidle, MD  dexlansoprazole (DEXILANT) 60 MG capsule Take 60 mg by mouth daily.    [provider]  fexofenadine (ALLEGRA) 180 MG tablet Take 180 mg by mouth daily.    [provider]  ipratropium (ATROVENT) 0.06 % nasal spray Place 1 spray into the nose as needed. 07/25/12   [provider]  linaclotide (LINZESS) 290 MCG CAPS capsule Take 290 mcg by mouth daily before breakfast.    [provider]  lovastatin (MEVACOR) 20 MG tablet Take 20 mg by mouth once. 05/29/11   [provider]  Milnacipran HCl (SAVELLA) 100 MG TABS tablet Take by mouth 2 (two) times daily.    [provider]  montelukast (SINGULAIR) 10 MG tablet Take 10 mg by mouth daily. 12/02/12   [provider]  PROAIR HFA 108 (90 BASE) MCG/ACT inhaler Inhale 2 puffs into the lungs as needed. 05/06/12   [provider]  QUEtiapine (SEROQUEL) 300 MG tablet Take 1 tablet (300 mg total) by  mouth 2 (two) times daily. Take one half (150mg ) each morning and each evening. Patient taking differently: Take 300 mg by mouth at bedtime. Take one half (150mg ) each morning and each evening. 10/16/13   Ruben Im, PA-C  trazodone (DESYREL) 300 MG tablet Take 1 tablet (300 mg total) by mouth at bedtime. 05/05/13   Puthuvel, Thompson Grayer, MD  esomeprazole (NEXIUM) 40 MG capsule Take 40 mg by mouth daily before breakfast.  04/28/20  [provider]  methylphenidate (RITALIN) 10 MG tablet  05/15/11 07/14/11  [provider]    Family History Family History  Problem Relation Age of Onset  . Bipolar disorder Father   . Suicidality Paternal Aunt   . Depression Paternal Aunt   .  Suicidality Maternal Uncle   . Bipolar disorder Maternal Grandmother   . Bipolar disorder Paternal Grandfather   . Bipolar disorder Brother   . Diabetes Mellitus II Brother   . Bipolar disorder Brother   . Heart attack Brother   . CAD Brother   . Drug abuse Brother   . Diabetes Mellitus II Mother   . Bipolar disorder Cousin   . Bipolar disorder Cousin   . Bipolar disorder Cousin     Social History Social History   Tobacco Use  . Smoking status: Never Smoker  . Smokeless tobacco: Never Used  Vaping Use  . Vaping Use: Never used  Substance Use Topics  . Alcohol use: No  . Drug use: No     Allergies   Amoxicillin, Butazolidin [phenylbutazone], Lidocaine, Morphine and related, Nsaids, Pseudoephedrine, and Sulfa antibiotics  Review of Systems Review of Systems Pertinent negatives listed in HPI Physical Exam Triage Vital Signs ED Triage Vitals  Enc Vitals Group     BP 05/05/20 1011 (!) 157/82     Pulse Rate 05/05/20 1011 (!) 103     Resp 05/05/20 1011 17     Temp 05/05/20 1011 98.8 F (37.1 C)     Temp Source 05/05/20 1011 Oral     SpO2 05/05/20 1011 99 %     Weight --      Height --      Head Circumference --      Peak Flow --      Pain Score 05/05/20 1012 0     Pain Loc --      Pain Edu? --      Excl. in [place]? --    No data found.  Updated Vital Signs BP (!) 157/82 (BP Location: Right Arm)   Pulse (!) 103   Temp 98.8 F (37.1 C) (Oral)   Resp 17   SpO2 99%   Visual Acuity Right Eye Distance:   Left Eye Distance:   Bilateral Distance:    Right Eye Near:   Left Eye Near:    Bilateral Near:     Physical Exam General appearance: alert, Ill-appearing, no distress Head: Normocephalic, without obvious abnormality, atraumatic ENT: mucosal edema, congestion, erythematous oropharynx w/o exudate Respiratory: Respirations even , unlabored, coarse lung sound, + wheeze, no crackles or rales  Heart: rate and rhythm normal. No gallop or murmurs noted on exam   Abdomen: BS +, no distention, no rebound tenderness, or no mass Extremities: No gross deformities Skin: Skin color, texture, turgor normal. No rashes seen  Psych: Appropriate mood and affect. Neurologic: Mental status: Alert, oriented to person, place, and time, thought content appropriate.   UC Treatments / Results  Labs (all labs ordered are listed, but only  abnormal results are displayed) Labs Reviewed  COVID-19, FLU A+B NAA    EKG   Radiology DG Chest 2 View  Result Date: 05/05/2020 CLINICAL DATA:  70 year old female with productive cough. EXAM: CHEST - 2 VIEW COMPARISON:  04/28/2020 he FINDINGS: Cardiomediastinal silhouette and pulmonary vasculature within normal limits. There is a 8 mm nodular opacity in the left upper lung. IMPRESSION: 1. No acute cardiopulmonary process. 2. 8 mm nodule in the left upper lung should be further evaluated with contrast-enhanced chest CT when patient condition allows. Electronically Signed   By: Miachel Roux M.D.   On: 05/05/2020 12:04    Procedures Procedures (including critical care time)  Medications Ordered in UC Medications - No data to display  Initial Impression / Assessment and Plan / UC Course  I have reviewed the triage vital signs and the nursing notes.  Pertinent labs & imaging results that were available during my care of the patient were reviewed by me and considered in my medical decision making (see chart for details).    Chest x-ray was negative.  COVID flu test pending. Will treat today for a lower respiratory infection.  Of note chest x-ray did reveal a 8 mm nodular opacity of the left upper lung which will require further imaging and work-up by patient's primary care provider.  For now advised to complete treatment as indicated: Doxycycline twice daily for 10 days and Promethazine DM to manage cough.  Following completion of antibiotic follow-up with primary care provider for follow-up visit and discuss options for further  imaging with a CT of the chest.  Patient verbalized understanding and agreement with plan. Final Clinical Impressions(s) / UC Diagnoses   Final diagnoses:  Cough  Lower respiratory infection     Discharge Instructions     Contact your primary care provider to schedule a follow-up CT scan of the chest to evaluation the lung nodule seen on your chest x-ray.  I am treating you today for lower respiratory infection.    ED Prescriptions    Medication Sig Dispense Auth. Provider   doxycycline (VIBRAMYCIN) 100 MG capsule Take 1 capsule (100 mg total) by mouth 2 (two) times daily. 20 capsule Scot Jun, FNP   promethazine-dextromethorphan (PROMETHAZINE-DM) 6.25-15 MG/5ML syrup Take 5 mLs by mouth 4 (four) times daily as needed for cough. 140 mL Scot Jun, FNP     PDMP not reviewed this encounter.   Scot Jun, [place] 05/12/20 [phone]

## 2020-05-05 NOTE — Discharge Instructions (Addendum)
Contact your primary care provider to schedule a follow-up CT scan of the chest to evaluation the lung nodule seen on your chest x-ray.  I am treating you today for lower respiratory infection.

## 2020-05-07 LAB — COVID-19, FLU A+B NAA
Influenza A, NAA: NOT DETECTED
Influenza B, NAA: NOT DETECTED
SARS-CoV-2, NAA: NOT DETECTED

## 2020-05-12 ENCOUNTER — Telehealth: Payer: Self-pay | Admitting: *Deleted

## 2020-05-12 NOTE — Telephone Encounter (Signed)
Pt called requesting a refill of the cough syrup.She reports that she is feeling better, but still has a cough. Advised her to f/u with her PCP as instructed at her visit, refill denied and she could try over the counter Delsym per Lavell Anchors, NP.

## 2020-05-30 DIAGNOSIS — Z8041 Family history of malignant neoplasm of ovary: Secondary | ICD-10-CM | POA: Insufficient documentation

## 2020-05-30 DIAGNOSIS — N950 Postmenopausal bleeding: Secondary | ICD-10-CM | POA: Insufficient documentation

## 2020-11-15 ENCOUNTER — Other Ambulatory Visit: Payer: Self-pay | Admitting: Unknown Physician Specialty

## 2020-11-15 DIAGNOSIS — R911 Solitary pulmonary nodule: Secondary | ICD-10-CM

## 2020-11-29 ENCOUNTER — Ambulatory Visit (INDEPENDENT_AMBULATORY_CARE_PROVIDER_SITE_OTHER): Payer: Medicare HMO

## 2020-11-29 ENCOUNTER — Other Ambulatory Visit: Payer: Self-pay

## 2020-11-29 DIAGNOSIS — R911 Solitary pulmonary nodule: Secondary | ICD-10-CM

## 2020-11-29 MED ORDER — IOHEXOL 300 MG/ML  SOLN
100.0000 mL | Freq: Once | INTRAMUSCULAR | Status: AC | PRN
  Administered 2020-11-29: 75 mL via INTRAVENOUS

## 2020-12-28 DIAGNOSIS — R200 Anesthesia of skin: Secondary | ICD-10-CM | POA: Insufficient documentation

## 2020-12-28 DIAGNOSIS — M5416 Radiculopathy, lumbar region: Secondary | ICD-10-CM | POA: Insufficient documentation

## 2020-12-28 DIAGNOSIS — G8929 Other chronic pain: Secondary | ICD-10-CM | POA: Insufficient documentation

## 2021-01-10 DIAGNOSIS — M5481 Occipital neuralgia: Secondary | ICD-10-CM | POA: Insufficient documentation

## 2021-01-10 DIAGNOSIS — M791 Myalgia, unspecified site: Secondary | ICD-10-CM | POA: Insufficient documentation

## 2021-04-19 ENCOUNTER — Other Ambulatory Visit: Payer: Self-pay | Admitting: Unknown Physician Specialty

## 2021-04-19 DIAGNOSIS — R918 Other nonspecific abnormal finding of lung field: Secondary | ICD-10-CM

## 2021-05-09 ENCOUNTER — Ambulatory Visit (INDEPENDENT_AMBULATORY_CARE_PROVIDER_SITE_OTHER): Payer: Medicare HMO

## 2021-05-09 ENCOUNTER — Other Ambulatory Visit: Payer: Self-pay

## 2021-05-09 DIAGNOSIS — R918 Other nonspecific abnormal finding of lung field: Secondary | ICD-10-CM

## 2021-05-26 ENCOUNTER — Emergency Department
Admission: EM | Admit: 2021-05-26 | Discharge: 2021-05-26 | Disposition: A | Discharge status: Home / Self Care | Payer: Medicare HMO | Source: Home / Self Care

## 2021-05-26 DIAGNOSIS — S81812A Laceration without foreign body, left lower leg, initial encounter: Secondary | ICD-10-CM | POA: Diagnosis not present

## 2021-05-26 MED ORDER — "XEROFORM PETROLAT GAUZE 1""X8"" EX MISC": 1.0000 "application " | Freq: Every day | CUTANEOUS | 0 refills | Status: DC

## 2021-05-26 MED ORDER — MUPIROCIN CALCIUM 2 % NA OINT: TOPICAL_OINTMENT | NASAL | 0 refills | Status: DC

## 2021-05-26 NOTE — ED Triage Notes (Signed)
Pt presents with left lower lef skin tear that occurred after cutting it on a metal tomato cage Monday. Pt states she she is receiving steroid injections and completed a blood thinner in December. Pt states her last Tdap is in date

## 2021-05-26 NOTE — ED Provider Notes (Signed)
[name]    CSN: [id number] Arrival date & time: 05/26/21  1054      History   Chief Complaint Chief Complaint  Patient presents with   skin tear    HPI Anna Tanner is a 71 y.o. female.   Pleasant 71 year old female presents today with concerns regarding a left lower extremity skin tear.  She states she sustained the injury on Monday afternoon after tripping over the wire cage for her tomato plants.  A large skin flap occurred, and patient states she and her husband pulled the flap down and have been treating with topical Xeroform since.  She states she did not come to the doctor because she had the same injury occur 1 year prior in which she went to the emergency room and it was not so due to her skin atrophy.  She states she is up-to-date on her Tdap, last completed 1 year ago.  Her primary reason for coming in was concerned with slight erythema just distal to the skin tear, and need of additional Xeroform.  She denies any pain swelling or drainage.  She reports it is started to heal significantly and feels it is improving.     Past Medical History:  Diagnosis Date   Asthma    Constipation    Sinusitis 03/02/11   affects all sinuses    Patient Active Problem List   Diagnosis Date Noted   Bipolar I disorder, most recent episode (or current) mixed, unspecified     Past Surgical History:  Procedure Laterality Date   ABDOMINAL HYSTERECTOMY     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    OB History   No obstetric history on file.      Home Medications    Prior to Admission medications   Medication Sig Start Date End Date Taking? Authorizing Provider  Bismuth Tribromoph-Petrolatum (XEROFORM PETROLAT GAUZE 1"X8") MISC Apply 1 application topically daily. 05/26/21  Yes Trampus Mcquerry L, PA  mupirocin nasal ointment (BACTROBAN) 2 % Apply one application to wound nightly, cover entirely  05/26/21  Yes Saul Dorsi L, PA  Plecanatide (TRULANCE PO) Take by mouth.   Yes [provider]  alendronate (FOSAMAX) 70 MG tablet Take 70 mg by mouth once a week. Take with a full glass of water on an empty stomach.    [provider]  azelastine (ASTELIN) 137 MCG/SPRAY nasal spray  01/08/12   [provider]  benzonatate (TESSALON) 100 MG capsule Take 1-2 capsules (100-200 mg total) by mouth 3 (three) times daily as needed for cough. Patient not taking: Reported on 05/26/2021 04/28/20   Robyn Haber, MD  buPROPion (WELLBUTRIN XL) 300 MG 24 hr tablet Take 300 mg by mouth daily.    [provider]  calcium carbonate (OS-CAL) 600 MG TABS Take 600 mg by mouth 2 (two) times daily with a meal.    [provider]  dexamethasone (DECADRON) 4 MG tablet Take 1 tablet (4 mg total) by mouth 2 (two) times daily with a meal. Patient not taking: Reported on 05/26/2021 04/28/20   Robyn Haber, MD  dexlansoprazole (DEXILANT) 60 MG capsule Take 60 mg by mouth daily.    [provider]  doxycycline (VIBRAMYCIN) 100 MG capsule Take 1 capsule (100 mg total) by mouth 2 (two) times daily. Patient not taking: Reported on 05/26/2021 05/05/20   Scot Jun, FNP  fexofenadine (ALLEGRA) 180 MG  tablet Take 180 mg by mouth daily.    [provider]  ipratropium (ATROVENT) 0.06 % nasal spray Place 1 spray into the nose as needed. 07/25/12   [provider]  linaclotide Rolan Lipa) 290 MCG CAPS capsule Take 290 mcg by mouth daily before breakfast. Patient not taking: Reported on 05/26/2021    [provider]  lovastatin (MEVACOR) 20 MG tablet Take 20 mg by mouth once. 05/29/11   [provider]  Milnacipran HCl (SAVELLA) 100 MG TABS tablet Take by mouth 2 (two) times daily.    [provider]  montelukast (SINGULAIR) 10 MG tablet Take 10 mg by mouth daily. 12/02/12   [provider]  PROAIR HFA 108 (90 BASE) MCG/ACT  inhaler Inhale 2 puffs into the lungs as needed. 05/06/12   [provider]  promethazine-dextromethorphan (PROMETHAZINE-DM) 6.25-15 MG/5ML syrup Take 5 mLs by mouth 4 (four) times daily as needed for cough. Patient not taking: Reported on 05/26/2021 05/05/20   Scot Jun, FNP  QUEtiapine (SEROQUEL) 300 MG tablet Take 1 tablet (300 mg total) by mouth 2 (two) times daily. Take one half (150mg ) each morning and each evening. Patient taking differently: Take 300 mg by mouth at bedtime. Take one half (150mg ) each morning and each evening. 10/16/13   Ruben Im, PA-C  trazodone (DESYREL) 300 MG tablet Take 1 tablet (300 mg total) by mouth at bedtime. 05/05/13   Puthuvel, Thompson Grayer, MD  esomeprazole (NEXIUM) 40 MG capsule Take 40 mg by mouth daily before breakfast.  04/28/20  [provider]  methylphenidate (RITALIN) 10 MG tablet  05/15/11 07/14/11  [provider]    Family History Family History  Problem Relation Age of Onset   Bipolar disorder Father    Suicidality Paternal Aunt    Depression Paternal Aunt    Suicidality Maternal Uncle    Bipolar disorder Maternal Grandmother    Bipolar disorder Paternal Grandfather    Bipolar disorder Brother    Diabetes Mellitus II Brother    Bipolar disorder Brother    Heart attack Brother    CAD Brother    Drug abuse Brother    Diabetes Mellitus II Mother    Bipolar disorder Cousin    Bipolar disorder Cousin    Bipolar disorder Cousin     Social History Social History   Tobacco Use   Smoking status: Never   Smokeless tobacco: Never  Vaping Use   Vaping Use: Never used  Substance Use Topics   Alcohol use: No   Drug use: No     Allergies   Amoxicillin, Butazolidin [phenylbutazone], Lidocaine, Morphine and related, Nsaids, Pseudoephedrine, and Sulfa antibiotics   Review of Systems Review of Systems As per HPI  Physical Exam Triage Vital Signs ED Triage Vitals  Enc Vitals Group     BP 05/26/21 1104  140/83     Pulse Rate 05/26/21 1104 84     Resp 05/26/21 1104 14     Temp 05/26/21 1104 98.2 F (36.8 C)     Temp Source 05/26/21 1104 Oral     SpO2 05/26/21 1104 98 %     Weight 05/26/21 1106 165 lb (74.8 kg)     Height --      Head Circumference --      Peak Flow --      Pain Score 05/26/21 1106 8     Pain Loc --      Pain Edu? --      Excl.  in [place]? --    No data found.  Updated Vital Signs BP 140/83 (BP Location: Left Arm)    Pulse 84    Temp 98.2 F (36.8 C) (Oral)    Resp 14    Wt 165 lb (74.8 kg)    SpO2 98%    BMI 28.32 kg/m   Visual Acuity Right Eye Distance:   Left Eye Distance:   Bilateral Distance:    Right Eye Near:   Left Eye Near:    Bilateral Near:     Physical Exam Vitals and nursing note reviewed. Exam conducted with a chaperone present.  Constitutional:      General: She is not in acute distress.    Appearance: Normal appearance. She is normal weight. She is not ill-appearing or toxic-appearing.  HENT:     Head: Normocephalic and atraumatic.  Pulmonary:     Effort: Pulmonary effort is normal.  Musculoskeletal:        General: Signs of injury (skin tear to L anterior shin, healing by secondary intention. Skin flap securely adhered to the skin, bruising noted to the superior portion of the flap. Otherwise no erythema or drainage, no warmth or swelling.) present. No swelling, tenderness or deformity. Normal range of motion.  Skin:    General: Skin is warm.     Findings: Bruising (see above) present.     Comments: Thin atrophied skin  Neurological:     General: No focal deficit present.     Mental Status: She is alert and oriented to person, place, and time.  Psychiatric:        Mood and Affect: Mood normal.     UC Treatments / Results  Labs (all labs ordered are listed, but only abnormal results are displayed) Labs Reviewed - No data to display  EKG   Radiology No results found.  Procedures Wound Care  Date/Time: 05/26/2021 12:58  PM Performed by: Chaney Malling, PA Authorized by: Chaney Malling, PA   Dressing:    Dressing applied:  Telfa pad and Xeroform 1x8   Wrapped with:  Coban 2 inch Post-procedure details:    Procedure completion:  Tolerated (including critical care time)  Medications Ordered in UC Medications - No data to display  Initial Impression / Assessment and Plan / UC Course  I have reviewed the triage vital signs and the nursing notes.  Pertinent labs & imaging results that were available during my care of the patient were reviewed by me and considered in my medical decision making (see chart for details).     Skin tear - today is the 4th day since the injury so it must close by secondary intention at this point. Pt admits healing well. Has been doing well and tolerating xeroform dressings. A new dressing was applied in office, Rx sent home. Recommended pt leave open at night, apart from topically applying mupirocin ointment.   Final Clinical Impressions(s) / UC Diagnoses   Final diagnoses:  Skin tear of left lower leg without complication, initial encounter     Discharge Instructions      Use topical xeroform petroleum guaze 8-12 hours daily. Apply to skin, and then use non-adherent pad and a coban wrap. During the night, apply mupirocen ointment. Cover the wound entirely.  Monitor for any increasing redness. F/U in one week for wound recheck.    ED Prescriptions     Medication Sig Dispense Auth. Provider   Bismuth Tribromoph-Petrolatum (XEROFORM PETROLAT GAUZE 1"X8") MISC Apply 1 application  topically daily. 10 each Regene Mccarthy L, PA   mupirocin nasal ointment (BACTROBAN) 2 % Apply one application to wound nightly, cover entirely 22 g Pierre Dellarocco L, PA      PDMP not reviewed this encounter.   Chaney Malling, [place] 05/26/21 1259

## 2021-05-26 NOTE — Discharge Instructions (Signed)
Use topical xeroform petroleum guaze 8-12 hours daily. Apply to skin, and then use non-adherent pad and a coban wrap. During the night, apply mupirocen ointment. Cover the wound entirely.  Monitor for any increasing redness. F/U in one week for wound recheck.

## 2021-05-31 ENCOUNTER — Emergency Department
Admission: EM | Admit: 2021-05-31 | Discharge: 2021-05-31 | Disposition: A | Discharge status: Home / Self Care | Payer: Medicare HMO | Source: Home / Self Care | Attending: Family Medicine | Admitting: Family Medicine

## 2021-05-31 ENCOUNTER — Telehealth: Payer: Self-pay | Admitting: Emergency Medicine

## 2021-05-31 ENCOUNTER — Other Ambulatory Visit: Payer: Self-pay

## 2021-05-31 ENCOUNTER — Encounter: Payer: Self-pay | Admitting: Emergency Medicine

## 2021-05-31 DIAGNOSIS — R296 Repeated falls: Secondary | ICD-10-CM

## 2021-05-31 DIAGNOSIS — S81802A Unspecified open wound, left lower leg, initial encounter: Secondary | ICD-10-CM | POA: Diagnosis not present

## 2021-05-31 MED ORDER — CEFADROXIL 500 MG PO CAPS: 500.0000 mg | ORAL_CAPSULE | Freq: Two times a day (BID) | ORAL | 0 refills | Status: DC

## 2021-05-31 NOTE — ED Provider Notes (Addendum)
[facility] T[facility]    CSN: [id number] Arrival date & time: 05/31/21  3716      History   Chief Complaint Chief Complaint  Patient presents with   Fall    HPI Anna Tanner is a 71 y.o. female.   HPI  Patient tells me that she had a fall a few months ago and injured her right leg.  She then came in on 05/26/2021 for another fall where she has a skin tear on her left leg ankle region.  She comes back today 5 days later for a third fall, all within the last 6 months or so, with a skin tear on the left shin region. In addition to discussing wound care for the 2 skin tears on her left leg, we had a discussion regarding falls.  She states she is on a vitamin D supplement.  She does not have any dizziness or lightheadedness, she states that her balance is good.  She states that she gets in a hurry and does not pay attention.  I discussed the importance of preventing falls so she does not end up with a broken bone, head injury, or worse. The lower wound, the older skin tear on her left leg, has developed some erythema and swelling.  It is also increasingly painful.  She has concern for infection. Tetanus is up-to-date  Past Medical History:  Diagnosis Date   Asthma    Constipation    Sinusitis 03/02/11   affects all sinuses    Patient Active Problem List   Diagnosis Date Noted   Bipolar I disorder, most recent episode (or current) mixed, unspecified     Past Surgical History:  Procedure Laterality Date   ABDOMINAL HYSTERECTOMY     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    OB History   No obstetric history on file.      Home Medications    Prior to Admission medications   Medication Sig Start Date End Date Taking? Authorizing Provider  cefadroxil (DURICEF) 500 MG capsule Take 1 capsule (500 mg total) by mouth 2 (two) times daily. 05/31/21  Yes Raylene Everts, MD  alendronate (FOSAMAX) 70 MG  tablet Take 70 mg by mouth once a week. Take with a full glass of water on an empty stomach.    [provider]  azelastine (ASTELIN) 137 MCG/SPRAY nasal spray  01/08/12   [provider]  Bismuth Tribromoph-Petrolatum (XEROFORM PETROLAT GAUZE 1"X8") MISC Apply 1 application topically daily. 05/26/21   Crain, Whitney L, PA  buPROPion (WELLBUTRIN XL) 300 MG 24 hr tablet Take 300 mg by mouth daily.    [provider]  calcium carbonate (OS-CAL) 600 MG TABS Take 600 mg by mouth 2 (two) times daily with a meal.    [provider]  dexlansoprazole (DEXILANT) 60 MG capsule Take 60 mg by mouth daily.    [provider]  fexofenadine (ALLEGRA) 180 MG tablet Take 180 mg by mouth daily.    [provider]  ipratropium (ATROVENT) 0.06 % nasal spray Place 1 spray into the nose as needed. 07/25/12   [provider]  lovastatin (MEVACOR) 20 MG tablet Take 20 mg by mouth once. 05/29/11   [provider]  Milnacipran HCl (SAVELLA) 100 MG TABS tablet Take by mouth 2 (two) times daily.    [provider]  montelukast (SINGULAIR) 10 MG tablet Take 10 mg by  mouth daily. 12/02/12   [provider]  mupirocin nasal ointment (BACTROBAN) 2 % Apply one application to wound nightly, cover entirely 05/26/21   Crain, Whitney L, PA  Plecanatide (TRULANCE PO) Take by mouth.    [provider]  PROAIR HFA 108 (90 BASE) MCG/ACT inhaler Inhale 2 puffs into the lungs as needed. 05/06/12   [provider]  QUEtiapine (SEROQUEL) 300 MG tablet Take 1 tablet (300 mg total) by mouth 2 (two) times daily. Take one half (150mg ) each morning and each evening. Patient taking differently: Take 300 mg by mouth at bedtime. Take one half (150mg ) each morning and each evening. 10/16/13   Ruben Im, PA-C  trazodone (DESYREL) 300 MG tablet Take 1 tablet (300 mg total) by mouth at bedtime. 05/05/13   Puthuvel, Thompson Grayer, MD  esomeprazole (NEXIUM) 40 MG  capsule Take 40 mg by mouth daily before breakfast.  04/28/20  [provider]  methylphenidate (RITALIN) 10 MG tablet  05/15/11 07/14/11  [provider]    Family History Family History  Problem Relation Age of Onset   Bipolar disorder Father    Suicidality Paternal Aunt    Depression Paternal Aunt    Suicidality Maternal Uncle    Bipolar disorder Maternal Grandmother    Bipolar disorder Paternal Grandfather    Bipolar disorder Brother    Diabetes Mellitus II Brother    Bipolar disorder Brother    Heart attack Brother    CAD Brother    Drug abuse Brother    Diabetes Mellitus II Mother    Bipolar disorder Cousin    Bipolar disorder Cousin    Bipolar disorder Cousin     Social History Social History   Tobacco Use   Smoking status: Never   Smokeless tobacco: Never  Vaping Use   Vaping Use: Never used  Substance Use Topics   Alcohol use: No   Drug use: No     Allergies   Amoxicillin, Butazolidin [phenylbutazone], Lidocaine, Morphine and related, Nsaids, Pseudoephedrine, and Sulfa antibiotics   Review of Systems Review of Systems  See HPI Physical Exam Triage Vital Signs ED Triage Vitals  Enc Vitals Group     BP 05/31/21 0929 (!) 143/87     Pulse Rate 05/31/21 0929 96     Resp 05/31/21 0929 18     Temp 05/31/21 0929 98.4 F (36.9 C)     Temp Source 05/31/21 0929 Oral     SpO2 05/31/21 0929 98 %     Weight 05/31/21 0931 160 lb (72.6 kg)     Height 05/31/21 0931 5\' 4"  (1.626 m)     Head Circumference --      Peak Flow --      Pain Score 05/31/21 0930 8     Pain Loc --      Pain Edu? --      Excl. in [place]? --    No data found.  Updated Vital Signs BP (!) 143/87 (BP Location: Left Arm)    Pulse 96    Temp 98.4 F (36.9 C) (Oral)    Resp 18    Ht 5\' 4"  (1.626 m)    Wt 72.6 kg    SpO2 98%    BMI 27.46 kg/m      Physical Exam Constitutional:      General: She is not in acute distress.    Appearance: Normal appearance. She is  well-developed.  HENT:     Head: Normocephalic and atraumatic.  Mouth/Throat:     Comments: Mask is in place Eyes:     Conjunctiva/sclera: Conjunctivae normal.     Pupils: Pupils are equal, round, and reactive to light.  Cardiovascular:     Rate and Rhythm: Normal rate.  Pulmonary:     Effort: Pulmonary effort is normal. No respiratory distress.  Abdominal:     General: There is no distension.     Palpations: Abdomen is soft.  Musculoskeletal:        General: Normal range of motion.     Cervical back: Normal range of motion.  Skin:    General: Skin is warm and dry.     Comments: Left leg is examined.  On the lower leg there is a C-shaped laceration that is approximately 5 to 6 cm round.  There is surrounding erythema and soft tissue swelling.  It is tender to palpation.  On the upper left leg just below the knee there are 2 very superficial skin tears that are too superficial to repair.  We discussed cleaning and care  Neurological:     Mental Status: She is alert.  Psychiatric:        Mood and Affect: Mood normal.     UC Treatments / Results  Labs (all labs ordered are listed, but only abnormal results are displayed) Labs Reviewed - No data to display  EKG   Radiology No results found.  Procedures Procedures (including critical care time)  Medications Ordered in UC Medications - No data to display  Initial Impression / Assessment and Plan / UC Course  I have reviewed the triage vital signs and the nursing notes.  Pertinent labs & imaging results that were available during my care of the patient were reviewed by me and considered in my medical decision making (see chart for details).     Final Clinical Impressions(s) / UC Diagnoses   Final diagnoses:  Wound, open, leg, left, initial encounter  Multiple falls     Discharge Instructions      Wash your wounds once a day, apply antibiotic ointment and a bandage Take antibiotic 2 times a day for a  week Return as needed   ED Prescriptions     Medication Sig Dispense Auth. Provider   cefadroxil (DURICEF) 500 MG capsule Take 1 capsule (500 mg total) by mouth 2 (two) times daily. 14 capsule Raylene Everts, MD      PDMP not reviewed this encounter.   Raylene Everts, MD 05/31/21 1030    Raylene Everts, MD 05/31/21 1236

## 2021-05-31 NOTE — ED Triage Notes (Addendum)
Fell yesterday tearing skin on lower left leg.

## 2021-05-31 NOTE — Discharge Instructions (Addendum)
Wash your wounds once a day, apply antibiotic ointment and a bandage Take antibiotic 2 times a day for a week Return as needed

## 2021-09-17 ENCOUNTER — Emergency Department
Admission: EM | Admit: 2021-09-17 | Discharge: 2021-09-17 | Disposition: A | Discharge status: Home / Self Care | Payer: Medicare HMO

## 2021-09-17 NOTE — ED Notes (Signed)
PT refused EMS and signed AMA form from EMS

## 2021-09-17 NOTE — ED Notes (Addendum)
Pts husband presented wife to UC after leaving AMA from [facility] ED for abd pain, diarrhea. Pt was found slumped over on bench outside of UC. After quick eval by provider and RN on staff, EMS was called for pt to return to ED for CT. (Which was already ordered at ED)

## 2021-10-10 NOTE — Progress Notes (Unsigned)
New Patient Office Visit  Subjective:  Patient ID: Anna Tanner, female    DOB: 05-Jun-1950  Age: 71 y.o. MRN: [id number]  CC: No chief complaint on file.    HPI Anna Tanner presents to establish care.   Past Medical History:  Diagnosis Date   Asthma    Constipation    Sinusitis 03/02/11   affects all sinuses    Past Surgical History:  Procedure Laterality Date   ABDOMINAL HYSTERECTOMY     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    Family History  Problem Relation Age of Onset   Bipolar disorder Father    Suicidality Paternal Aunt    Depression Paternal Aunt    Suicidality Maternal Uncle    Bipolar disorder Maternal Grandmother    Bipolar disorder Paternal Grandfather    Bipolar disorder Brother    Diabetes Mellitus II Brother    Bipolar disorder Brother    Heart attack Brother    CAD Brother    Drug abuse Brother    Diabetes Mellitus II Mother    Bipolar disorder Cousin    Bipolar disorder Cousin    Bipolar disorder Cousin     Social History   Socioeconomic History   Marital status: Married    Spouse name: Not on file   Number of children: Not on file   Years of education: Not on file   Highest education level: Not on file  Occupational History   Not on file  Tobacco Use   Smoking status: Never   Smokeless tobacco: Never  Vaping Use   Vaping Use: Never used  Substance and Sexual Activity   Alcohol use: No   Drug use: No   Sexual activity: Yes    Partners: Male  Other Topics Concern   Not on file  Social History Narrative   Not on file   Social Determinants of Health   Financial Resource Strain: Not on file  Food Insecurity: Not on file  Transportation Needs: Not on file  Physical Activity: Not on file  Stress: Not on file  Social Connections: Not on file  Intimate Partner Violence: Not on file    ROS Review of Systems  Objective:   Today's Vitals: There  were no vitals taken for this visit.  Physical Exam  Assessment & Plan:   1. Encounter to establish care ***   Outpatient Encounter Medications as of 10/11/2021  Medication Sig   alendronate (FOSAMAX) 70 MG tablet Take 70 mg by mouth once a week. Take with a full glass of water on an empty stomach.   azelastine (ASTELIN) 137 MCG/SPRAY nasal spray    Bismuth Tribromoph-Petrolatum (XEROFORM PETROLAT GAUZE 1"X8") MISC Apply 1 application topically daily.   buPROPion (WELLBUTRIN XL) 300 MG 24 hr tablet Take 300 mg by mouth daily.   calcium carbonate (OS-CAL) 600 MG TABS Take 600 mg by mouth 2 (two) times daily with a meal.   cefadroxil (DURICEF) 500 MG capsule Take 1 capsule (500 mg total) by mouth 2 (two) times daily.   dexlansoprazole (DEXILANT) 60 MG capsule Take 60 mg by mouth daily.   fexofenadine (ALLEGRA) 180 MG tablet Take 180 mg by mouth daily.   ipratropium (ATROVENT) 0.06 % nasal spray Place 1 spray into the nose as needed.   lovastatin (MEVACOR) 20 MG tablet Take 20 mg by mouth once.   Milnacipran HCl (SAVELLA) 100 MG TABS  tablet Take by mouth 2 (two) times daily.   montelukast (SINGULAIR) 10 MG tablet Take 10 mg by mouth daily.   mupirocin nasal ointment (BACTROBAN) 2 % Apply one application to wound nightly, cover entirely   Plecanatide (TRULANCE PO) Take by mouth.   PROAIR HFA 108 (90 BASE) MCG/ACT inhaler Inhale 2 puffs into the lungs as needed.   QUEtiapine (SEROQUEL) 300 MG tablet Take 1 tablet (300 mg total) by mouth 2 (two) times daily. Take one half ('150mg'$ ) each morning and each evening. (Patient taking differently: Take 300 mg by mouth at bedtime. Take one half ('150mg'$ ) each morning and each evening.)   trazodone (DESYREL) 300 MG tablet Take 1 tablet (300 mg total) by mouth at bedtime.   [DISCONTINUED] esomeprazole (NEXIUM) 40 MG capsule Take 40 mg by mouth daily before breakfast.   [DISCONTINUED] methylphenidate (RITALIN) 10 MG tablet    No facility-administered  encounter medications on file as of 10/11/2021.    Follow-up: No follow-ups on file.   Clearnce Sorrel, DNP, APRN, FNP-BC [facility]

## 2021-10-11 ENCOUNTER — Encounter: Payer: Self-pay | Admitting: Medical-Surgical

## 2021-10-11 ENCOUNTER — Ambulatory Visit (INDEPENDENT_AMBULATORY_CARE_PROVIDER_SITE_OTHER): Payer: Medicare HMO | Admitting: Medical-Surgical

## 2021-10-11 VITALS — BP 108/71 | HR 99 | Resp 20 | Ht 64.0 in | Wt 153.3 lb

## 2021-10-11 DIAGNOSIS — K589 Irritable bowel syndrome without diarrhea: Secondary | ICD-10-CM | POA: Diagnosis not present

## 2021-10-11 DIAGNOSIS — F324 Major depressive disorder, single episode, in partial remission: Secondary | ICD-10-CM

## 2021-10-11 DIAGNOSIS — F3177 Bipolar disorder, in partial remission, most recent episode mixed: Secondary | ICD-10-CM

## 2021-10-11 DIAGNOSIS — Z9989 Dependence on other enabling machines and devices: Secondary | ICD-10-CM

## 2021-10-11 DIAGNOSIS — K219 Gastro-esophageal reflux disease without esophagitis: Secondary | ICD-10-CM | POA: Diagnosis not present

## 2021-10-11 DIAGNOSIS — Z7689 Persons encountering health services in other specified circumstances: Secondary | ICD-10-CM

## 2021-10-11 DIAGNOSIS — M797 Fibromyalgia: Secondary | ICD-10-CM

## 2021-10-11 DIAGNOSIS — E785 Hyperlipidemia, unspecified: Secondary | ICD-10-CM

## 2021-10-11 DIAGNOSIS — G4733 Obstructive sleep apnea (adult) (pediatric): Secondary | ICD-10-CM | POA: Diagnosis not present

## 2021-10-26 ENCOUNTER — Telehealth: Payer: Self-pay | Admitting: Medical-Surgical

## 2021-10-26 DIAGNOSIS — M7989 Other specified soft tissue disorders: Secondary | ICD-10-CM

## 2021-10-26 NOTE — Telephone Encounter (Signed)
Patient called in and stated her pain management doctor believes she has a blood clot in her right leg and need to be seen. Patient aware message being sent to Dr. Darene Lamer to review and order a scan.

## 2021-10-27 ENCOUNTER — Ambulatory Visit (INDEPENDENT_AMBULATORY_CARE_PROVIDER_SITE_OTHER): Payer: Medicare HMO

## 2021-10-27 DIAGNOSIS — M7989 Other specified soft tissue disorders: Secondary | ICD-10-CM

## 2021-10-27 DIAGNOSIS — M79661 Pain in right lower leg: Secondary | ICD-10-CM | POA: Diagnosis not present

## 2021-10-27 NOTE — Assessment & Plan Note (Signed)
I have not seen this patient but it sounds like her pain management doctor think she has a blood clot, but did not order a DVT ultrasound, we will go ahead and order this ultrasound.

## 2021-10-27 NOTE — Telephone Encounter (Signed)
Stat lower extremity DVT ultrasound ordered.

## 2021-11-25 ENCOUNTER — Telehealth: Payer: Self-pay | Admitting: Medical-Surgical

## 2021-11-25 ENCOUNTER — Other Ambulatory Visit: Payer: Self-pay | Admitting: Medical-Surgical

## 2021-11-25 DIAGNOSIS — F3176 Bipolar disorder, in full remission, most recent episode depressed: Secondary | ICD-10-CM

## 2021-11-25 MED ORDER — TRAZODONE HCL 300 MG PO TABS: 300.0000 mg | ORAL_TABLET | Freq: Every day | ORAL | 1 refills | Status: DC

## 2021-11-25 NOTE — Telephone Encounter (Signed)
Patient advised RX sent to pharmacy. AMUCK

## 2021-11-25 NOTE — Telephone Encounter (Signed)
Refill has been sent.  °

## 2021-11-25 NOTE — Telephone Encounter (Signed)
Patient stated she needed a refill on the medication listed below. Please advise. AMUCK   trazodone (DESYREL) 300 MG tablet    [place], [place] Phone:  [phone]  Fax:  [phone]

## 2021-11-29 ENCOUNTER — Telehealth: Payer: Self-pay

## 2021-11-29 MED ORDER — TRAZODONE HCL 150 MG PO TABS: 300.0000 mg | ORAL_TABLET | Freq: Every day | ORAL | 1 refills | Status: DC

## 2021-11-29 NOTE — Telephone Encounter (Signed)
Anna Tanner called and states the cost of the Trazodone 300 mg is around $300. She states it is cheaper for her to have Trazodone 150 mg to take 2 at bedtime.   Pended prescription just in case.

## 2021-12-02 ENCOUNTER — Other Ambulatory Visit: Payer: Self-pay

## 2021-12-02 MED ORDER — LOVASTATIN 20 MG PO TABS: 20.0000 mg | ORAL_TABLET | Freq: Every day | ORAL | 1 refills | Status: DC

## 2021-12-02 NOTE — Progress Notes (Signed)
Medication refill has been sent.  Because we have never prescribed the medication here in our clinic before, they have likely been reaching out to her previous provider. ___________________________________________ Clearnce Sorrel, DNP, APRN, FNP-BC [facility]

## 2021-12-02 NOTE — Progress Notes (Signed)
The patient called stating that [facility] keeps calling her to let her know that they have requested her refill again for this medication and she is needing a refill.  Last office visit 10/11/2021 Last filled 05/29/2011

## 2021-12-06 ENCOUNTER — Ambulatory Visit (INDEPENDENT_AMBULATORY_CARE_PROVIDER_SITE_OTHER): Payer: Medicare HMO | Admitting: Medical-Surgical

## 2021-12-06 DIAGNOSIS — Z0000 Encounter for general adult medical examination without abnormal findings: Secondary | ICD-10-CM

## 2021-12-06 NOTE — Progress Notes (Signed)
MEDICARE ANNUAL WELLNESS VISIT  12/06/2021  Telephone Visit Disclaimer This Medicare AWV was conducted by telephone due to national recommendations for restrictions regarding the COVID-19 Pandemic (e.g. social distancing).  I verified, using two identifiers, that I am speaking with Anna Tanner or their authorized healthcare agent. I discussed the limitations, risks, security, and privacy concerns of performing an evaluation and management service by telephone and the potential availability of an in-person appointment in the future. The patient expressed understanding and agreed to proceed.  Location of Patient: Home Location of Provider (nurse):  In the office.  Subjective:    Anna Tanner is a 71 y.o. female patient of Samuel Bouche, NP who had a Medicare Annual Wellness Visit today via telephone. Anna Tanner is Retired and lives with their spouse. she does not have any children. she reports that she is socially active and does interact with friends/family regularly. she is minimally physically active and enjoys gardening.  Patient Care Team: Samuel Bouche, NP as PCP - General (Nurse Practitioner)     12/06/2021    3:25 PM 10/11/2021   10:09 AM 10/07/2013    3:26 PM  Advanced Directives  Does Patient Have a Medical Advance Directive? Yes Yes   Type of Advance Directive Living will [place];Living will;Out of facility DNR (pink MOST or yellow form)   Does patient want to make changes to medical advance directive? No - Patient declined    Copy of [place] in Chart? No - copy requested No - copy requested      Information is confidential and restricted. Go to Review Flowsheets to unlock data.    Hospital Utilization Over the Past 12 Months: # of hospitalizations or ER visits: 4 # of surgeries: 0  Review of Systems    Patient reports that her overall health is worse compared to last year.  History obtained from chart review and the  patient  Patient Reported Readings (BP, Pulse, CBG, Weight, etc) none  Pain Assessment Pain : 0-10 Pain Score: 10-Worst pain ever Pain Type: Chronic pain Pain Location: Other (Comment) (Sciative pain) Pain Orientation: Right Pain Descriptors / Indicators: Aching, Constant Pain Onset: More than a month ago Pain Frequency: Constant Pain Relieving Factors: heat  Pain Relieving Factors: heat  Current Medications & Allergies (verified) Allergies as of 12/06/2021       Reactions   Aspirin    Other reaction(s): Bleeding   Pseudoephedrine Swelling, Anaphylaxis   Tongue swells   Nsaids Other (See Comments)   Oral ulcers Other reaction(s): GI Upset (intolerance) Oral ulcers Stomach ulcers   Amoxicillin    Butazolidin [phenylbutazone] Other (See Comments)   Oral ulcers   Diflunisal    Other reaction(s): gastrointestinal discomfort, GI upset   Lidocaine    Morphine And Related Nausea And Vomiting   Sulfa Antibiotics    Omeprazole    Other reaction(s): Cough (ALLERGY/intolerance)        Medication List        Accurate as of December 06, 2021  3:43 PM. If you have any questions, ask your nurse or doctor.          alendronate 70 MG tablet Commonly known as: FOSAMAX Take 70 mg by mouth once a week. Take with a full glass of water on an empty stomach.   B-12 1000 MCG Caps 1 tablet by mouth every other day   Biotin 5 MG Tabs Take by mouth.   buPROPion 150 MG 24  hr tablet Commonly known as: WELLBUTRIN XL Take 150 mg by mouth daily. Takes two tablets once a day at bedtime.   CENTRUM SILVER PO 1 tablet Orally Once a day   dexlansoprazole 60 MG capsule Commonly known as: DEXILANT Take by mouth.   fexofenadine 180 MG tablet Commonly known as: ALLEGRA Take 180 mg by mouth daily.   linaclotide 290 MCG Caps capsule Commonly known as: LINZESS daily.   lovastatin 20 MG tablet Commonly known as: MEVACOR Take 1 tablet (20 mg total) by mouth daily.   methocarbamol  750 MG tablet Commonly known as: ROBAXIN   montelukast 10 MG tablet Commonly known as: SINGULAIR Take 10 mg by mouth daily.   QUEtiapine 300 MG tablet Commonly known as: SEROQUEL Take 1 tablet (300 mg total) by mouth 2 (two) times daily. Take one half ('150mg'$ ) each morning and each evening. What changed: when to take this   Savella 100 MG Tabs tablet Generic drug: Milnacipran HCl Take by mouth 2 (two) times daily.   traZODone 150 MG tablet Commonly known as: DESYREL Take 2 tablets (300 mg total) by mouth at bedtime.   TRULANCE PO Take by mouth.   vitamin C 1000 MG tablet Take 1,000 mg by mouth daily.        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   TOTAL ABDOMINAL HYSTERECTOMY     Family History  Problem Relation Age of Onset   Bipolar disorder Father    Suicidality Paternal Aunt    Depression Paternal Aunt    Suicidality Maternal Uncle    Bipolar disorder Maternal Grandmother    Bipolar disorder Paternal Grandfather    Bipolar disorder Brother    Diabetes Mellitus II Brother    Bipolar disorder Brother    Heart attack Brother    CAD Brother    Drug abuse Brother    Diabetes Mellitus II Mother    Bipolar disorder Cousin    Bipolar disorder Cousin    Bipolar disorder Cousin    Social History   Socioeconomic History   Marital status: Married    Spouse name: Anna Tanner   Number of children: 0   Years of education: 12   Highest education level: 12th grade  Occupational History   Occupation: Retired.   Occupation: self-employed  Tobacco Use   Smoking status: Never   Smokeless tobacco: Never  Vaping Use   Vaping Use: Never used  Substance and Sexual Activity   Alcohol use: No   Drug use: No   Sexual  activity: Yes    Partners: Male  Other Topics Concern   Not on file  Social History Narrative   Lives with her husband. She enjoys gardening.   Social Determinants of Health   Financial Resource Strain: Low Risk  (12/06/2021)   Overall Financial Resource Strain (CARDIA)    Difficulty of Paying Living Expenses: Not hard at all  Food Insecurity: No Food Insecurity (12/06/2021)   Hunger Vital Sign    Worried About Running Out of Food in the Last Year: Never true    Ran Out of Food in the Last Year: Never true  Transportation Needs: No Transportation Needs (12/06/2021)   PRAPARE - Transportation  Lack of Transportation (Medical): No    Lack of Transportation (Non-Medical): No  Physical Activity: Inactive (12/06/2021)   Exercise Vital Sign    Days of Exercise per Week: 0 days    Minutes of Exercise per Session: 0 min  Stress: No Stress Concern Present (12/06/2021)   [place]    Feeling of Stress : Not at all  Social Connections: Anna Tanner (12/06/2021)   Social Connection and Isolation Panel [NHANES]    Frequency of Communication with Friends and Family: More than three times a week    Frequency of Social Gatherings with Friends and Family: More than three times a week    Attends Religious Services: More than 4 times per year    Active Member of [employer] or Organizations: Yes    Attends Archivist Meetings: More than 4 times per year    Marital Status: Married    Activities of Daily Living    12/06/2021    3:36 PM  In your present state of health, do you have any difficulty performing the following activities:  Hearing? 0  Vision? 0  Difficulty concentrating or making decisions? 0  Walking or climbing stairs? 1  Comment due to sciatica nerve pain.  Dressing or bathing? 0  Preparing Food and eating ? N  Using the Toilet? N  In the past six months, have you accidently leaked urine? N  Do you have  problems with loss of bowel control? N  Managing your Medications? N  Managing your Finances? N  Housekeeping or managing your Housekeeping? Y  Comment they have a housekeeper    Patient Education/ Literacy How often do you need to have someone help you when you read instructions, pamphlets, or other written materials from your doctor or pharmacy?: 1 - Never What is the last grade level you completed in school?: 12th grade  Exercise Current Exercise Habits: The patient does not participate in regular exercise at present, Exercise limited by: orthopedic condition(s);neurologic condition(s)  Diet Patient reports consuming 3 meals a day and 1 snack(s) a day Patient reports that her primary diet is: Regular Patient reports that she does have regular access to food.   Depression Screen    12/06/2021    3:26 PM 10/11/2021   11:01 AM  PHQ 2/9 Scores  PHQ - 2 Score 0 0  PHQ- 9 Score 0 0     Fall Risk    12/06/2021    3:25 PM 10/11/2021   11:01 AM  [place] in the past year? 1 1  Number falls in past yr: 1 1  Injury with Fall? 1 0  Risk for fall due to : History of fall(s);Impaired balance/gait History of fall(s)  Follow up Falls evaluation completed;Education provided;Falls prevention discussed Falls evaluation completed     Objective:  Anna Tanner seemed alert and oriented and she participated appropriately during our telephone visit.  Blood Pressure Weight BMI  BP Readings from Last 3 Encounters:  10/11/21 108/71  05/31/21 (!) 143/87  05/26/21 140/83   Wt Readings from Last 3 Encounters:  10/11/21 153 lb 4.8 oz (69.5 kg)  05/31/21 160 lb (72.6 kg)  05/26/21 165 lb (74.8 kg)   BMI Readings from Last 1 Encounters:  10/11/21 26.31 kg/m    *Unable to obtain current vital signs, weight, and BMI due to telephone visit type  Hearing/Vision  Anna Tanner did not seem to have difficulty with hearing/understanding during  the telephone conversation Reports that she has  had a formal eye exam by an eye care professional within the past year Reports that she has not had a formal hearing evaluation within the past year *Unable to fully assess hearing and vision during telephone visit type  Cognitive Function:    12/06/2021    3:38 PM  6CIT Screen  What Year? 0 points  What month? 0 points  What time? 0 points  Count back from 20 0 points  Months in reverse 2 points  Repeat phrase 0 points  Total Score 2 points   (Normal:0-7, Significant for Dysfunction: >8)  Normal Cognitive Function Screening: Yes   Immunization & Health Maintenance Record Immunization History  Administered Date(s) Administered   Influenza, Seasonal, Injecte, Preservative Fre 02/13/2017, 12/31/2018   Influenza-Unspecified 01/29/2013, 02/17/2013, 01/22/2014, 01/10/2015   Janssen (J&J) SARS-COV-2 Vaccination 07/11/2019, 03/31/2020   Pneumococcal Polysaccharide-23 01/24/2016, 12/18/2016    Health Maintenance  Topic Date Due   COVID-19 Vaccine (3 - Booster for Janssen series) 12/22/2021 (Originally 05/26/2020)   Zoster Vaccines- Shingrix (1 of 2) 03/08/2022 (Originally 10/26/1969)   INFLUENZA VACCINE  07/30/2022 (Originally 11/29/2021)   Pneumonia Vaccine 72+ Years old (2 - PCV) 12/07/2022 (Originally 12/18/2017)   MAMMOGRAM  12/07/2022 (Originally 10/26/2000)   DEXA SCAN  12/07/2022 (Originally 10/27/2015)   COLONOSCOPY (Pts 45-14yr Insurance coverage will need to be confirmed)  12/07/2022 (Originally 10/27/1995)   TETANUS/TDAP  12/07/2022 (Originally 10/26/1969)   HPV VACCINES  Aged Out   Hepatitis C Screening  Discontinued       Assessment  This is a routine wellness examination for [employer]  Health Maintenance: Due or Overdue There are no preventive care reminders to display for this patient.   LElizabeth SauerPercell does not need a referral for Community Assistance: Care Management:   no Social Work:    no Prescription Assistance:  no Nutrition/Diabetes  Education:  no   Plan:  Personalized Goals  Goals Addressed               This Visit's Progress     Patient Stated (pt-stated)        To be able to be more physically active.        Personalized Health Maintenance & Screening Recommendations  Pneumococcal vaccine  Td vaccine Screening mammography Bone densitometry screening Colorectal cancer screening Shingrix vaccine  Patient would like to discuss health maintenance with PCP in September at her next appointment.  Lung Cancer Screening Recommended: no (Low Dose CT Chest recommended if Age 71-80years, 30 pack-year currently smoking OR have quit w/in past 15 years) Hepatitis C Screening recommended: no HIV Screening recommended: no  Advanced Directives: Written information was not prepared per patient's request.  Referrals & Orders No orders of the defined types were placed in this encounter.   Follow-up Plan Follow-up with JSamuel Bouche NP as planned Discuss health maintenance with PCP at her physical. Medicare wellness visit in one year. AVS printed and mailed to the patient.   I have personally reviewed and noted the following in the patient's chart:   Medical and social history Use of alcohol, tobacco or illicit drugs  Current medications and supplements Functional ability and status Nutritional status Physical activity Advanced directives List of other physicians Hospitalizations, surgeries, and ER visits in previous 12 months Vitals Screenings to include cognitive, depression, and falls Referrals and appointments  In addition, I have reviewed and discussed with LRoque Liascertain preventive protocols, quality metrics, and  best practice recommendations. A written personalized care plan for preventive services as well as general preventive health recommendations is available and can be mailed to the patient at her request.      Tinnie Gens, RN BSN  12/06/2021

## 2021-12-06 NOTE — Patient Instructions (Addendum)
[place] Maintenance Summary and Written Plan of Care  Anna Tanner ,  Thank you for allowing me to perform your Medicare Annual Wellness Visit and for your ongoing commitment to your health.   Health Maintenance & Immunization History Health Maintenance  Topic Date Due   COVID-19 Vaccine (3 - Booster for Janssen series) 12/22/2021 (Originally 05/26/2020)   Zoster Vaccines- Shingrix (1 of 2) 03/08/2022 (Originally 10/26/1969)   INFLUENZA VACCINE  07/30/2022 (Originally 11/29/2021)   Pneumonia Vaccine 54+ Years old (2 - PCV) 12/07/2022 (Originally 12/18/2017)   MAMMOGRAM  12/07/2022 (Originally 10/26/2000)   DEXA SCAN  12/07/2022 (Originally 10/27/2015)   COLONOSCOPY (Pts 45-61yr Insurance coverage will need to be confirmed)  12/07/2022 (Originally 10/27/1995)   TETANUS/TDAP  12/07/2022 (Originally 10/26/1969)   HPV VACCINES  Aged Out   Hepatitis C Screening  Discontinued   Immunization History  Administered Date(s) Administered   Influenza, Seasonal, Injecte, Preservative Fre 02/13/2017, 12/31/2018   Influenza-Unspecified 01/29/2013, 02/17/2013, 01/22/2014, 01/10/2015   Janssen (J&J) SARS-COV-2 Vaccination 07/11/2019, 03/31/2020   Pneumococcal Polysaccharide-23 01/24/2016, 12/18/2016    These are the patient goals that we discussed:  Goals Addressed               This Visit's Progress     Patient Stated (pt-stated)        To be able to be more physically active.          This is a list of Health Maintenance Items that are overdue or due now: Pneumococcal vaccine  Td vaccine Screening mammography Bone densitometry screening Colorectal cancer screening Shingrix vaccine  Patient would like to discuss health maintenance with PCP in September at her next appointment.    Orders/Referrals Placed Today: No orders of the defined types were placed in this encounter.  (Contact our referral department at [phone] you have not spoken with  someone about your referral appointment within the next 5 days)    Follow-up Plan Follow-up with JSamuel Bouche NP as planned Discuss health maintenance with PCP at her physical. Medicare wellness visit in one year. AVS printed and mailed to the patient.      Health Maintenance, Female Adopting a healthy lifestyle and getting preventive care are important in promoting health and wellness. Ask your health care provider about: The right schedule for you to have regular tests and exams. Things you can do on your own to prevent diseases and keep yourself healthy. What should I know about diet, weight, and exercise? Eat a healthy diet  Eat a diet that includes plenty of vegetables, fruits, low-fat dairy products, and lean protein. Do not eat a lot of foods that are high in solid fats, added sugars, or sodium. Maintain a healthy weight Body mass index (BMI) is used to identify weight problems. It estimates body fat based on height and weight. Your health care provider can help determine your BMI and help you achieve or maintain a healthy weight. Get regular exercise Get regular exercise. This is one of the most important things you can do for your health. Most adults should: Exercise for at least 150 minutes each week. The exercise should increase your heart rate and make you sweat (moderate-intensity exercise). Do strengthening exercises at least twice a week. This is in addition to the moderate-intensity exercise. Spend less time sitting. Even light physical activity can be beneficial. Watch cholesterol and blood lipids Have your blood tested for lipids and cholesterol at 71years of age, then have this test  every 5 years. Have your cholesterol levels checked more often if: Your lipid or cholesterol levels are high. You are older than 71 years of age. You are at high risk for heart disease. What should I know about cancer screening? Depending on your health history and family history,  you may need to have cancer screening at various ages. This may include screening for: Breast cancer. Cervical cancer. Colorectal cancer. Skin cancer. Lung cancer. What should I know about heart disease, diabetes, and high blood pressure? Blood pressure and heart disease High blood pressure causes heart disease and increases the risk of stroke. This is more likely to develop in people who have high blood pressure readings or are overweight. Have your blood pressure checked: Every 3-5 years if you are 17-47 years of age. Every year if you are 47 years old or older. Diabetes Have regular diabetes screenings. This checks your fasting blood sugar level. Have the screening done: Once every three years after age 66 if you are at a normal weight and have a low risk for diabetes. More often and at a younger age if you are overweight or have a high risk for diabetes. What should I know about preventing infection? Hepatitis B If you have a higher risk for hepatitis B, you should be screened for this virus. Talk with your health care provider to find out if you are at risk for hepatitis B infection. Hepatitis C Testing is recommended for: Everyone born from 18 through 1965. Anyone with known risk factors for hepatitis C. Sexually transmitted infections (STIs) Get screened for STIs, including gonorrhea and chlamydia, if: You are sexually active and are younger than 71 years of age. You are older than 71 years of age and your health care provider tells you that you are at risk for this type of infection. Your sexual activity has changed since you were last screened, and you are at increased risk for chlamydia or gonorrhea. Ask your health care provider if you are at risk. Ask your health care provider about whether you are at high risk for HIV. Your health care provider may recommend a prescription medicine to help prevent HIV infection. If you choose to take medicine to prevent HIV, you should  first get tested for HIV. You should then be tested every 3 months for as long as you are taking the medicine. Pregnancy If you are about to stop having your period (premenopausal) and you may become pregnant, seek counseling before you get pregnant. Take 400 to 800 micrograms (mcg) of folic acid every day if you become pregnant. Ask for birth control (contraception) if you want to prevent pregnancy. Osteoporosis and menopause Osteoporosis is a disease in which the bones lose minerals and strength with aging. This can result in bone fractures. If you are 77 years old or older, or if you are at risk for osteoporosis and fractures, ask your health care provider if you should: Be screened for bone loss. Take a calcium or vitamin D supplement to lower your risk of fractures. Be given hormone replacement therapy (HRT) to treat symptoms of menopause. Follow these instructions at home: Alcohol use Do not drink alcohol if: Your health care provider tells you not to drink. You are pregnant, may be pregnant, or are planning to become pregnant. If you drink alcohol: Limit how much you have to: 0-1 drink a day. Know how much alcohol is in your drink. In the [place] one drink equals one 12 oz bottle of beer (355  mL), one 5 oz glass of wine (148 mL), or one 1 oz glass of hard liquor (44 mL). Lifestyle Do not use any products that contain nicotine or tobacco. These products include cigarettes, chewing tobacco, and vaping devices, such as e-cigarettes. If you need help quitting, ask your health care provider. Do not use street drugs. Do not share needles. Ask your health care provider for help if you need support or information about quitting drugs. General instructions Schedule regular health, dental, and eye exams. Stay current with your vaccines. Tell your health care provider if: You often feel depressed. You have ever been abused or do not feel safe at home. Summary Adopting a healthy lifestyle  and getting preventive care are important in promoting health and wellness. Follow your health care provider's instructions about healthy diet, exercising, and getting tested or screened for diseases. Follow your health care provider's instructions on monitoring your cholesterol and blood pressure. This information is not intended to replace advice given to you by your health care provider. Make sure you discuss any questions you have with your health care provider. Document Revised: 09/06/2020 Document Reviewed: 09/06/2020 Elsevier Patient Education  [place].

## 2022-01-04 DIAGNOSIS — M5136 Other intervertebral disc degeneration, lumbar region: Secondary | ICD-10-CM | POA: Insufficient documentation

## 2022-01-04 DIAGNOSIS — R87619 Unspecified abnormal cytological findings in specimens from cervix uteri: Secondary | ICD-10-CM | POA: Insufficient documentation

## 2022-01-11 ENCOUNTER — Telehealth: Payer: Self-pay | Admitting: General Practice

## 2022-01-11 NOTE — Telephone Encounter (Signed)
Transition Care Management Follow-up Telephone Call Date of discharge and from where: 01/10/22 from [place] How have you been since you were released from the hospital? Doing better. She has an appt with GI doctor tomorrow. Any questions or concerns? No  Items Reviewed: Did the pt receive and understand the discharge instructions provided? Yes  Medications obtained and verified? No  Other? No  Any new allergies since your discharge? No  Dietary orders reviewed? Yes Do you have support at home? Yes   Home Care and Equipment/Supplies: Were home health services ordered? no  Functional Questionnaire: (I = Independent and D = Dependent) ADLs: I  Bathing/Dressing- I  Meal Prep- I  Eating- I  Maintaining continence- I  Transferring/Ambulation- I  Managing Meds- I  Follow up appointments reviewed:  PCP Hospital f/u appt confirmed? No   Specialist Hospital f/u appt confirmed? Yes  Scheduled to see the GI on 01/12/22. Are transportation arrangements needed? No  If their condition worsens, is the pt aware to call PCP or go to the Emergency Dept.? Yes Was the patient provided with contact information for the PCP's office or ED? Yes Was to pt encouraged to call back with questions or concerns? Yes

## 2022-01-16 ENCOUNTER — Other Ambulatory Visit: Payer: Self-pay

## 2022-01-22 NOTE — Progress Notes (Signed)
Complete physical exam  Patient: Anna Tanner   DOB: [birth year]   71 y.o. Female  MRN: [id number]  Subjective:    Chief Complaint  Patient presents with   Annual Exam    Anna Tanner is a 71 y.o. female who presents today for a complete physical exam. She reports consuming a general diet.  Does yard work periodically. No other intentional exercise.  She generally feels fairly well. She reports sleeping well. She does not have additional problems to discuss today.    Most recent fall risk assessment:    01/23/2022   11:41 AM  [place] in the past year? 1  Number falls in past yr: 1  Injury with Fall? 1  Comment working in the yard  Risk for fall due to : History of fall(s)  Follow up Falls evaluation completed     Most recent depression screenings:    01/23/2022   11:41 AM 12/06/2021    3:26 PM  PHQ 2/9 Scores  PHQ - 2 Score 0 0  PHQ- 9 Score  0    Vision:Within last year and Dental: No current dental problems and Receives regular dental care    Patient Care Team: Samuel Bouche, NP as PCP - General (Nurse Practitioner)   Outpatient Medications Prior to Visit  Medication Sig   albuterol (VENTOLIN HFA) 108 (90 Base) MCG/ACT inhaler INHALE 2 PUFFS INTO LUNGS EVERY 4 TO 6 HOURS AS NEEDED FOR SHORTNESS OF BREATH FOR WHEEZING   alendronate (FOSAMAX) 70 MG tablet Take 70 mg by mouth once a week. Take with a full glass of water on an empty stomach.   Ascorbic Acid (VITAMIN C) 1000 MG tablet Take 1,000 mg by mouth daily.   Biotin 5 MG TABS Take by mouth.   buPROPion (WELLBUTRIN XL) 150 MG 24 hr tablet Take 150 mg by mouth daily. Takes two tablets once a day at bedtime.   Cyanocobalamin (B-12) 1000 MCG CAPS 1 tablet by mouth every other day   dexlansoprazole (DEXILANT) 60 MG capsule Take by mouth.   fexofenadine (ALLEGRA) 180 MG tablet Take 180 mg by mouth daily.   fluticasone (FLONASE) 50 MCG/ACT nasal spray USE 1 TO 2 SPRAY(S) IN EACH NOSTRIL ONCE DAILY    linaclotide (LINZESS) 290 MCG CAPS capsule daily.   lovastatin (MEVACOR) 20 MG tablet Take 1 tablet by mouth daily.   Milnacipran HCl (SAVELLA) 100 MG TABS tablet Take by mouth 2 (two) times daily.   montelukast (SINGULAIR) 10 MG tablet Take by mouth.   Multiple Vitamins-Minerals (CENTRUM SILVER PO) 1 tablet Orally Once a day   QUEtiapine (SEROQUEL) 300 MG tablet Take 1 tablet (300 mg total) by mouth 2 (two) times daily. Take one half ('150mg'$ ) each morning and each evening. (Patient taking differently: Take 300 mg by mouth at bedtime. Take one half ('150mg'$ ) each morning and each evening.)   Tdap (ADACEL) 08-30-13.5 LF-MCG/0.5 injection    traZODone (DESYREL) 150 MG tablet Take 2 tablets (300 mg total) by mouth at bedtime.   [DISCONTINUED] acetaminophen-codeine (TYLENOL #3) 300-30 MG tablet Take 1 tablet by mouth every 6 (six) hours as needed. (Patient not taking: Reported on 01/23/2022)   [DISCONTINUED] ammonium lactate (AMLACTIN) 12 % cream APPLY 1 APPLICATION TOPICALLY EVERY DAY AFTER THE SHOWER (Patient not taking: Reported on 01/23/2022)   [DISCONTINUED] lovastatin (MEVACOR) 20 MG tablet Take 1 tablet (20 mg total) by mouth daily. (Patient not taking: Reported on 01/23/2022)   [DISCONTINUED] methocarbamol (ROBAXIN)  750 MG tablet  (Patient not taking: Reported on 01/23/2022)   [DISCONTINUED] methocarbamol (ROBAXIN) 750 MG tablet Take 1 tablet by mouth every 8 (eight) hours. (Patient not taking: Reported on 01/23/2022)   [DISCONTINUED] montelukast (SINGULAIR) 10 MG tablet Take 10 mg by mouth daily. (Patient not taking: Reported on 01/23/2022)   [DISCONTINUED] oxyCODONE (OXY IR/ROXICODONE) 5 MG immediate release tablet Take 1 tablet every 4 hours by oral route. (Patient not taking: Reported on 01/23/2022)   [DISCONTINUED] Plecanatide (TRULANCE PO) Take by mouth. (Patient not taking: Reported on 12/06/2021)   No facility-administered medications prior to visit.   Review of Systems  Constitutional:  Negative  for chills, fever, malaise/fatigue and weight loss.  HENT:  Negative for congestion, ear pain, hearing loss, sinus pain and sore throat.   Eyes:  Negative for blurred vision, photophobia and pain.  Respiratory:  Negative for cough, shortness of breath and wheezing.   Cardiovascular:  Negative for chest pain, palpitations and leg swelling.  Gastrointestinal:  Positive for abdominal pain and diarrhea. Negative for constipation, heartburn, nausea and vomiting.  Genitourinary:  Negative for dysuria, frequency and urgency.  Musculoskeletal:  Positive for myalgias. Negative for falls and neck pain.  Skin:  Negative for itching and rash.  Neurological:  Negative for dizziness, weakness and headaches.  Endo/Heme/Allergies:  Negative for polydipsia. Does not bruise/bleed easily.  Psychiatric/Behavioral:  Negative for depression, substance abuse and suicidal ideas. The patient is not nervous/anxious and does not have insomnia.       Objective:     BP 102/67 (BP Location: Left Arm, Cuff Size: Normal)   Pulse 79   Resp 20   Ht '5\' 4"'$  (1.626 m)   Wt 142 lb 11.2 oz (64.7 kg)   SpO2 100%   BMI 24.49 kg/m    Physical Exam Constitutional:      General: She is not in acute distress.    Appearance: Normal appearance. She is not ill-appearing.  HENT:     Head: Normocephalic and atraumatic.     Right Ear: Tympanic membrane, ear canal and external ear normal. There is no impacted cerumen.     Left Ear: Tympanic membrane, ear canal and external ear normal. There is no impacted cerumen.     Nose: Nose normal.     Mouth/Throat:     Mouth: Mucous membranes are moist.     Pharynx: No oropharyngeal exudate or posterior oropharyngeal erythema.  Eyes:     General: No scleral icterus.       Right eye: No discharge.        Left eye: No discharge.     Extraocular Movements: Extraocular movements intact.     Conjunctiva/sclera: Conjunctivae normal.     Pupils: Pupils are equal, round, and reactive to  light.  Neck:     Thyroid: No thyromegaly.     Vascular: No carotid bruit or JVD.     Trachea: Trachea normal.  Cardiovascular:     Rate and Rhythm: Normal rate and regular rhythm.     Pulses: Normal pulses.     Heart sounds: Normal heart sounds. No murmur heard.    No friction rub. No gallop.  Pulmonary:     Effort: Pulmonary effort is normal. No respiratory distress.     Breath sounds: Normal breath sounds. No wheezing.  Abdominal:     General: Bowel sounds are normal. There is no distension.     Palpations: Abdomen is soft.     Tenderness: There is no abdominal  tenderness. There is no guarding.  Musculoskeletal:        General: Normal range of motion.     Cervical back: Normal range of motion and neck supple.  Skin:    General: Skin is warm and dry.  Neurological:     Mental Status: She is alert and oriented to person, place, and time.     Cranial Nerves: No cranial nerve deficit.  Psychiatric:        Mood and Affect: Mood normal.        Behavior: Behavior normal.        Thought Content: Thought content normal.        Judgment: Judgment normal.   No results found for any visits on 01/23/22.     Assessment & Plan:    Routine Health Maintenance and Physical Exam  Immunization History  Administered Date(s) Administered   Fluad Quad(high Dose 65+) 01/23/2022   Influenza, Seasonal, Injecte, Preservative Fre 02/13/2017, 12/31/2018   Influenza-Unspecified 01/29/2013, 02/17/2013, 01/22/2014, 01/10/2015   Janssen (J&J) SARS-COV-2 Vaccination 07/11/2019, 03/31/2020   Pneumococcal Polysaccharide-23 01/24/2016, 12/18/2016   Tdap 10/19/2021    Health Maintenance  Topic Date Due   COVID-19 Vaccine (3 - Janssen risk series) 02/01/2022 (Originally 05/26/2020)   Zoster Vaccines- Shingrix (1 of 2) 03/08/2022 (Originally [birth year]26/1969)   Pneumonia Vaccine 63+ Years old (2 - PCV) 12/07/2022 (Originally 12/18/2017)   MAMMOGRAM  12/07/2022 (Originally 03/30/2022)   COLONOSCOPY (Pts  45-105yr Insurance coverage will need to be confirmed)  03/21/2026   TETANUS/TDAP  10/20/2031   INFLUENZA VACCINE  Completed   DEXA SCAN  Completed   HPV VACCINES  Aged Out   Hepatitis C Screening  Discontinued    Discussed health benefits of physical activity, and encouraged her to engage in regular exercise appropriate for her age and condition.  1. Annual physical exam Checking labs as below.  Up-to-date on preventative care.  Wellness information provided with AVS. - Lipid panel - COMPLETE METABOLIC PANEL WITH GFR - CBC with Differential/Platelet  2. Dyslipidemia Checking labs as below.  Continue lovastatin 20 mg daily. - Lipid panel - COMPLETE METABOLIC PANEL WITH GFR  3. Elevated glucose Checking hemoglobin A1c. - Hemoglobin A1c  4. Gastroesophageal reflux disease without esophagitis Checking CBC with differential.  Continue Dexilant per GI recommendation. - CBC with Differential/Platelet  5. Need for influenza vaccination Flu vaccine given today. - Flu Vaccine QUAD High Dose(Fluad)  6. Encounter for screening mammogram for malignant neoplasm of breast Mammogram ordered. - MM DIGITAL SCREENING BILATERAL; Future  Return in about 6 months (around 07/24/2022) for chronic disease follow up.   JSamuel Bouche NP

## 2022-01-23 ENCOUNTER — Ambulatory Visit (INDEPENDENT_AMBULATORY_CARE_PROVIDER_SITE_OTHER): Payer: Medicare HMO | Admitting: Medical-Surgical

## 2022-01-23 ENCOUNTER — Encounter: Payer: Self-pay | Admitting: Medical-Surgical

## 2022-01-23 VITALS — BP 102/67 | HR 79 | Resp 20 | Ht 64.0 in | Wt 142.7 lb

## 2022-01-23 DIAGNOSIS — R7309 Other abnormal glucose: Secondary | ICD-10-CM

## 2022-01-23 DIAGNOSIS — K219 Gastro-esophageal reflux disease without esophagitis: Secondary | ICD-10-CM | POA: Diagnosis not present

## 2022-01-23 DIAGNOSIS — Z23 Encounter for immunization: Secondary | ICD-10-CM | POA: Diagnosis not present

## 2022-01-23 DIAGNOSIS — E785 Hyperlipidemia, unspecified: Secondary | ICD-10-CM | POA: Diagnosis not present

## 2022-01-23 DIAGNOSIS — G4733 Obstructive sleep apnea (adult) (pediatric): Secondary | ICD-10-CM

## 2022-01-23 DIAGNOSIS — Z1231 Encounter for screening mammogram for malignant neoplasm of breast: Secondary | ICD-10-CM

## 2022-01-23 DIAGNOSIS — Z0000 Encounter for general adult medical examination without abnormal findings: Secondary | ICD-10-CM | POA: Diagnosis not present

## 2022-01-24 LAB — COMPLETE METABOLIC PANEL WITH GFR
AG Ratio: 1.8 (calc) (ref 1.0–2.5)
ALT: 18 U/L (ref 6–29)
AST: 17 U/L (ref 10–35)
Albumin: 4.2 g/dL (ref 3.6–5.1)
Alkaline phosphatase (APISO): 60 U/L (ref 37–153)
BUN: 8 mg/dL (ref 7–25)
CO2: 29 mmol/L (ref 20–32)
Calcium: 9.8 mg/dL (ref 8.6–10.4)
Chloride: 105 mmol/L (ref 98–110)
Creat: 0.66 mg/dL (ref 0.60–1.00)
Globulin: 2.4 g/dL (calc) (ref 1.9–3.7)
Glucose, Bld: 87 mg/dL (ref 65–99)
Potassium: 5.3 mmol/L (ref 3.5–5.3)
Sodium: 142 mmol/L (ref 135–146)
Total Bilirubin: 0.4 mg/dL (ref 0.2–1.2)
Total Protein: 6.6 g/dL (ref 6.1–8.1)
eGFR: 94 mL/min/{1.73_m2} (ref >=60)

## 2022-01-24 LAB — CBC WITH DIFFERENTIAL/PLATELET
Absolute Monocytes: 702 cells/uL (ref 200–950)
Basophils Absolute: 72 cells/uL (ref 0–200)
Basophils Relative: 1.1 %
Eosinophils Absolute: 299 cells/uL (ref 15–500)
Eosinophils Relative: 4.6 %
HCT: 38.2 % (ref 35.0–45.0)
Hemoglobin: 13.1 g/dL (ref 11.7–15.5)
Lymphs Abs: 1905 cells/uL (ref 850–3900)
MCH: 34.5 pg — ABNORMAL HIGH (ref 27.0–33.0)
MCHC: 34.3 g/dL (ref 32.0–36.0)
MCV: 100.5 fL — ABNORMAL HIGH (ref 80.0–100.0)
MPV: 9.1 fL (ref 7.5–12.5)
Monocytes Relative: 10.8 %
Neutro Abs: 3523 cells/uL (ref 1500–7800)
Neutrophils Relative %: 54.2 %
Platelets: 504 10*3/uL — ABNORMAL HIGH (ref 140–400)
RBC: 3.8 10*6/uL (ref 3.80–5.10)
RDW: 12.1 % (ref 11.0–15.0)
Total Lymphocyte: 29.3 %
WBC: 6.5 10*3/uL (ref 3.8–10.8)

## 2022-01-24 LAB — LIPID PANEL
Cholesterol: 211 mg/dL — ABNORMAL HIGH (ref <200)
HDL: 98 mg/dL (ref >=50)
LDL Cholesterol (Calc): 97 mg/dL (calc)
Non-HDL Cholesterol (Calc): 113 mg/dL (calc) (ref <130)
Total CHOL/HDL Ratio: 2.2 (calc) (ref <5.0)
Triglycerides: 72 mg/dL (ref <150)

## 2022-01-24 LAB — HEMOGLOBIN A1C
Hgb A1c MFr Bld: 5.5 % of total Hgb (ref <5.7)
Mean Plasma Glucose: 111 mg/dL
eAG (mmol/L): 6.2 mmol/L

## 2022-01-28 IMAGING — DX DG CHEST 2V
2 series · 2 of 2 positions shown · non-contrast
Comparison: 04/28/2020 he

CLINICAL DATA: 69-year-old female with productive cough.

EXAM:
CHEST - 2 VIEW

[chest pa]
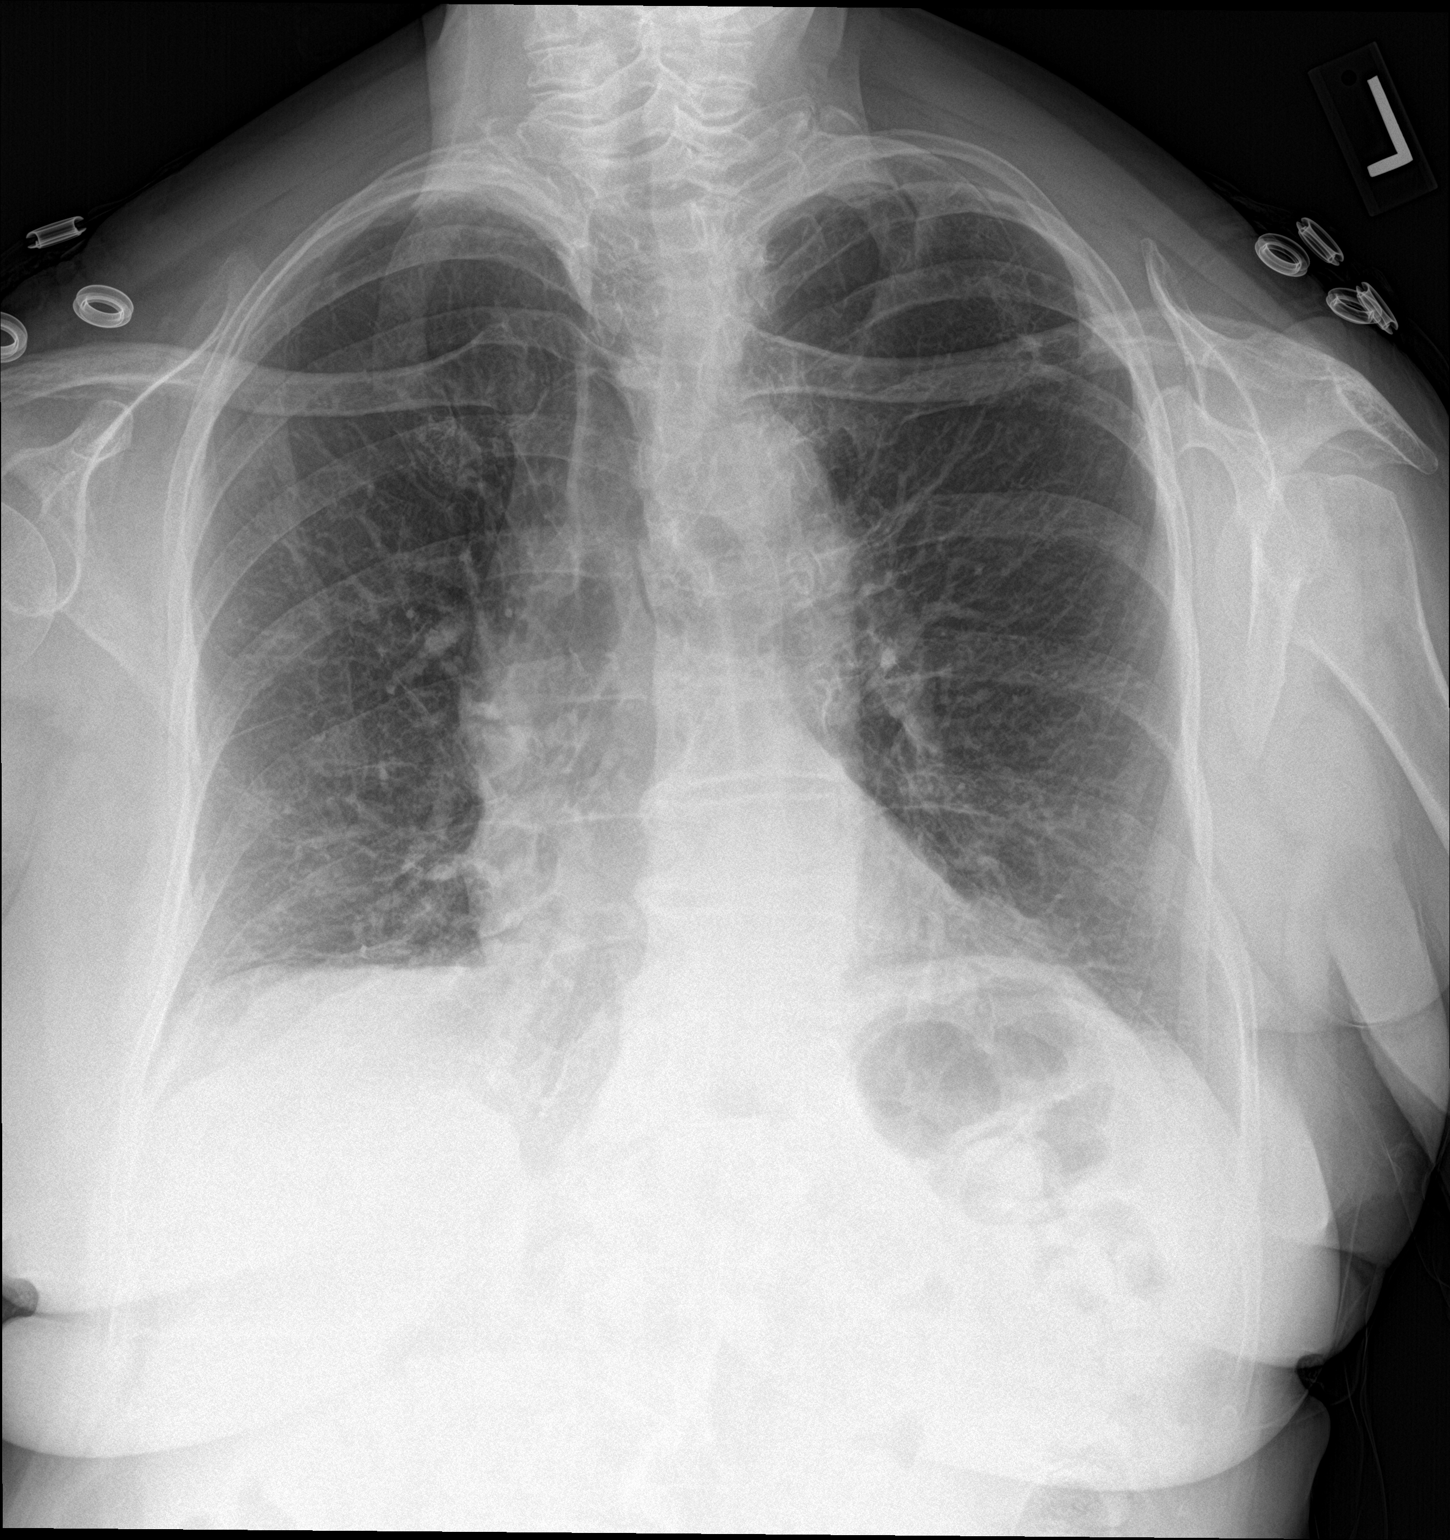

[chest lat]
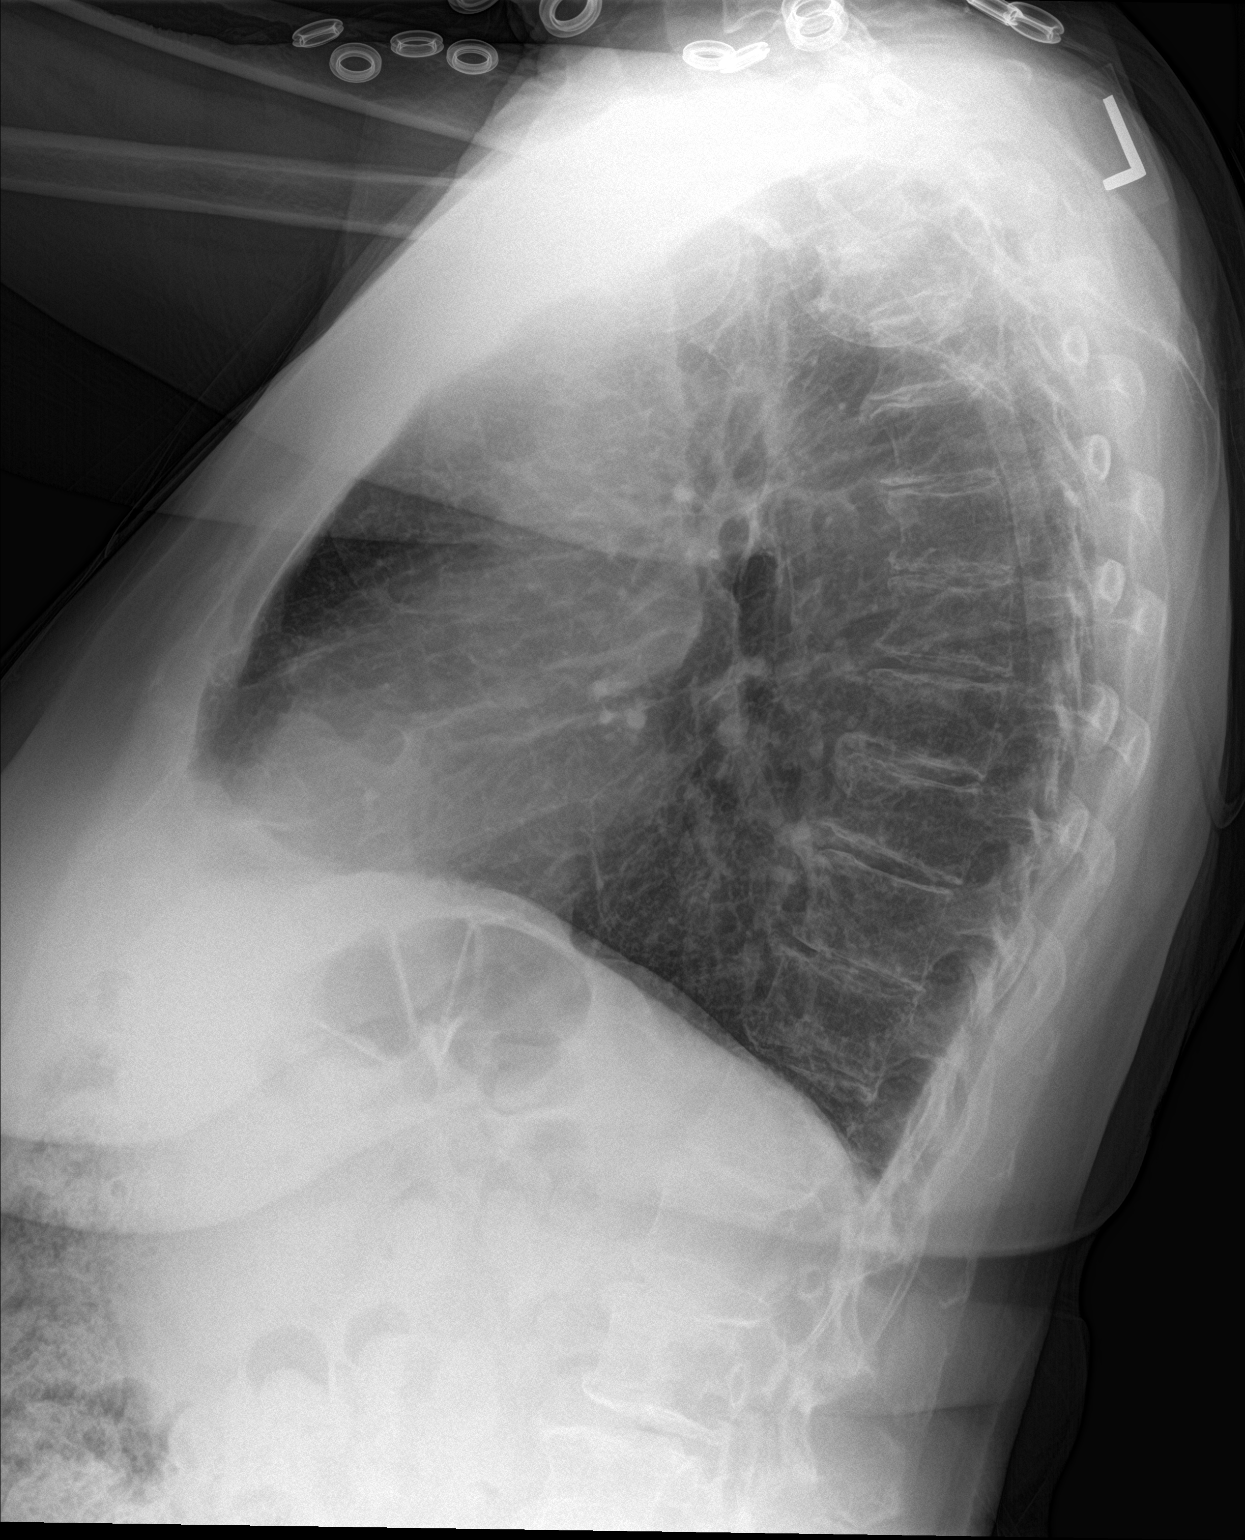

[2 of 2 positions shown; findings below may reference images not displayed]

FINDINGS: Cardiomediastinal silhouette and pulmonary vasculature within normal
limits.

There is a 8 mm nodular opacity in the left upper lung.
IMPRESSION: 1. No acute cardiopulmonary process.
2. 8 mm nodule in the left upper lung should be further evaluated
with contrast-enhanced chest CT when patient condition allows.

## 2022-01-31 ENCOUNTER — Other Ambulatory Visit: Payer: Self-pay

## 2022-01-31 DIAGNOSIS — F3162 Bipolar disorder, current episode mixed, moderate: Secondary | ICD-10-CM

## 2022-01-31 NOTE — Progress Notes (Unsigned)
Patient called requesting to have these 2 Rx's to be sent to [facility].  You have never filled these Rx's. They were filled by a historical provider.  I spoke with the patient and got clarification.  Bupropion is (300 mg) 1 tablet daily with breakfast.  Quetiapine (300 mg) 1 tablet at bed time.

## 2022-02-01 ENCOUNTER — Ambulatory Visit: Payer: Medicare HMO

## 2022-02-01 ENCOUNTER — Encounter: Payer: Self-pay | Admitting: Medical-Surgical

## 2022-02-01 MED ORDER — BUPROPION HCL ER (XL) 300 MG PO TB24: 300.0000 mg | ORAL_TABLET | Freq: Every day | ORAL | 1 refills | Status: DC

## 2022-02-01 MED ORDER — QUETIAPINE FUMARATE 300 MG PO TABS: 300.0000 mg | ORAL_TABLET | Freq: Every day | ORAL | 1 refills | Status: DC

## 2022-02-01 NOTE — Progress Notes (Signed)
I spoke with the patient and got clarification.  Bupropion is (300 mg) 1 tablet daily with breakfast.  Quetiapine (300 mg) 1 tablet at bed time.

## 2022-02-20 ENCOUNTER — Telehealth: Payer: Self-pay | Admitting: Medical-Surgical

## 2022-02-20 NOTE — Telephone Encounter (Signed)
Patient called states that the wrong dosage on quetiapine fumarate '300mg'$  was [facility] to [facility] the dosage should  be '200mg'$  she would like a call back [phone]

## 2022-02-21 NOTE — Telephone Encounter (Signed)
I spoke with the patient and she said that she has always been on 200 mg at bedtime and don't know how we got her medication messed up. She stated that she doesn't have the money to get this medication refilled again at the right dosage since we are the ones that changed it without her notice. That is why she is going through the [facility] to pay for her medications. She stated that someone here is going to have to pay for her medication.

## 2022-02-22 ENCOUNTER — Other Ambulatory Visit: Payer: Self-pay | Admitting: Medical-Surgical

## 2022-02-22 ENCOUNTER — Other Ambulatory Visit: Payer: Self-pay

## 2022-02-22 DIAGNOSIS — F3162 Bipolar disorder, current episode mixed, moderate: Secondary | ICD-10-CM

## 2022-02-22 MED ORDER — QUETIAPINE FUMARATE 200 MG PO TABS: 200.0000 mg | ORAL_TABLET | Freq: Every day | ORAL | 1 refills | Status: DC

## 2022-02-22 MED ORDER — QUETIAPINE FUMARATE 200 MG PO TABS: 200.0000 mg | ORAL_TABLET | Freq: Every day | ORAL | 0 refills | Status: DC

## 2022-02-22 NOTE — Telephone Encounter (Signed)
Quetiapine '200mg'$  at bedtime was sent to the [facility] pharmacy so that patient has the right dose. When informed that the medication was sent, she became verbally abusive to our staff at our front desk. She has been verbally abusive to several other staff members here over the last few days when she was contacted regarding the prescription discrepancy. Since this has happened on several different occasions and with multiple staff members, I cannot condone this behavior in a patient/provider relationship. The situation was discussed with my supervising provider, Dr. Luetta Nutting, and our office manager, Everrett Coombe. A dismissal letter has been sent to the patient via MyChart and a printed copy will be mailed to her residence. We will continue to see to her urgent medical needs for 30 days but recommend she seek an alternate PCP as soon as possible.   ___________________________________________ Clearnce Sorrel, DNP, APRN, FNP-BC [facility]

## 2022-02-22 NOTE — Telephone Encounter (Signed)
The patient is coming to the office today after 1 to bring the bottle the shows that she is prescribed 200 mg. Can you pend the refill for this medication just in case you are not in the office when the patient arrives to the office?

## 2022-03-07 ENCOUNTER — Ambulatory Visit
Admission: EM | Admit: 2022-03-07 | Discharge: 2022-03-07 | Disposition: A | Discharge status: Home / Self Care | Payer: Medicare HMO | Attending: Family Medicine | Admitting: Family Medicine

## 2022-03-07 DIAGNOSIS — S51819A Laceration without foreign body of unspecified forearm, initial encounter: Secondary | ICD-10-CM | POA: Diagnosis not present

## 2022-03-07 MED ORDER — LIDOCAINE-EPINEPHRINE-TETRACAINE (LET) TOPICAL GEL
3.0000 mL | Freq: Once | TOPICAL | Status: AC
Start: 2022-03-07 — End: 2022-03-07
  Administered 2022-03-07: 3 mL via TOPICAL

## 2022-03-07 NOTE — Medical Student Note (Signed)
[facility] Provider Student Note For educational purposes for Medical, PA and NP students only and not part of the legal medical record.   CSN: [id number] Arrival date & time: 03/07/22  1638      History   Chief Complaint Chief Complaint  Patient presents with   Arm Injury    RT, avulsion    HPI Anna Tanner is a 71 y.o. female.  Anna Tanner presents to the office today after sustaining a skin tear to her right posterior forearm this afternoon. States she was working a lot around [employer] today and while lifting a skillet from the stove to the sink, caught the back of her arm on the corner of the over-hanging cabinet. Last T-Dap was 10/19/2021. The wound appears as a superficial, partial thickness shearing of the epidermal layer from the dermal layer, no visible subcutaneous tissue. Wound bed appears red, moist, bleeding well controled. Wound is square in shape, 1.5 inches on all sides, two small skin flaps are adhered on two out of four edges. Patient appears to be tolerating pain well in office.   Arm Injury   Past Medical History:  Diagnosis Date   Asthma    Constipation    Sinusitis 03/02/11   affects all sinuses    Patient Active Problem List   Diagnosis Date Noted   Abnormal cervical Papanicolaou smear 01/04/2022   Degeneration of lumbar intervertebral disc 01/04/2022   Right leg swelling 10/27/2021   Occipital neuralgia 01/10/2021   Chronic pain of right knee 12/28/2020   Lumbar radiculopathy 12/28/2020   Numbness of right foot 12/28/2020   Family history of ovarian cancer 05/30/2020   OSA on CPAP 08/26/2019   Macrocytosis without anemia 04/18/2019   Thrombocytosis 03/12/2019   Cervical radiculitis 02/07/2019   Gastroesophageal reflux disease without esophagitis 12/31/2018   Elevated glucose 04/28/2018   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   Enthesopathy of right hip region 10/15/2017   Biceps tendinitis of right upper extremity 04/20/2017   Medial  epicondylitis of right elbow 01/14/2016   Primary localized osteoarthritis of right knee 11/30/2015   Left elbow pain 08/24/2015   Joint pain in fingers of right hand 03/03/2014   Right wrist tendonitis 03/03/2014   Bipolar I disorder, most recent episode mixed, in partial remission ([place]) 02/12/2014   Depression 01/23/2014   Scoliosis 01/23/2014   Lumbosacral spondylosis 10/23/2013   Constipation 05/28/2013   Family history of colonic polyps 05/28/2013   IBS (irritable bowel syndrome) 05/28/2013   Dyslipidemia 12/01/2010   Atypical chest pain 12/01/2010   Fibromyalgia 04/22/1971    Past Surgical History:  Procedure Laterality Date   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   TOTAL ABDOMINAL HYSTERECTOMY      OB History   No obstetric history on file.      Home Medications    Prior to Admission medications   Medication Sig Start Date End Date Taking? Authorizing Provider  albuterol (VENTOLIN HFA) 108 (90 Base) MCG/ACT inhaler INHALE 2 PUFFS INTO LUNGS EVERY 4 TO 6 HOURS AS NEEDED FOR SHORTNESS OF BREATH FOR WHEEZING    [provider]  alendronate (FOSAMAX) 70 MG tablet Take 70 mg by mouth once a week. Take with a full glass of water on an empty stomach.    [provider]  Ascorbic Acid (VITAMIN C) 1000 MG tablet Take 1,000 mg by mouth daily.    [provider]  Biotin 5 MG TABS Take by mouth.    [provider]  buPROPion (WELLBUTRIN XL) 300 MG 24 hr tablet Take 1 tablet (300 mg total) by mouth daily. 02/01/22   Samuel Bouche, NP  Cyanocobalamin (B-12) 1000 MCG CAPS 1 tablet by mouth every other day    [provider]  dexlansoprazole (DEXILANT) 60 MG capsule Take by mouth. 11/29/21   [provider]  fexofenadine (ALLEGRA) 180 MG tablet Take 180 mg by mouth daily.    [provider]  fluticasone (FLONASE) 50 MCG/ACT nasal spray USE 1 TO 2 SPRAY(S)  IN EACH NOSTRIL ONCE DAILY    [provider]  linaclotide (LINZESS) 290 MCG CAPS capsule daily.    [provider]  lovastatin (MEVACOR) 20 MG tablet Take 1 tablet by mouth daily.    [provider]  Milnacipran HCl (SAVELLA) 100 MG TABS tablet Take by mouth 2 (two) times daily.    [provider]  montelukast (SINGULAIR) 10 MG tablet Take by mouth.    [provider]  Multiple Vitamins-Minerals (CENTRUM SILVER PO) 1 tablet Orally Once a day    [provider]  QUEtiapine (SEROQUEL) 200 MG tablet Take 1 tablet (200 mg total) by mouth at bedtime. Emergency supply - delay with mail order pharmacy. Please dispense. Thanks. 02/22/22   Samuel Bouche, NP  Tdap (ADACEL) 08-30-13.5 LF-MCG/0.5 injection     [provider]  traZODone (DESYREL) 150 MG tablet Take 2 tablets (300 mg total) by mouth at bedtime. 11/29/21   Samuel Bouche, NP  esomeprazole (NEXIUM) 40 MG capsule Take 40 mg by mouth daily before breakfast.  04/28/20  [provider]  methylphenidate (RITALIN) 10 MG tablet  05/15/11 07/14/11  [provider]    Family History Family History  Problem Relation Age of Onset   Bipolar disorder Father    Suicidality Paternal Aunt    Depression Paternal Aunt    Suicidality Maternal Uncle    Bipolar disorder Maternal Grandmother    Bipolar disorder Paternal Grandfather    Bipolar disorder Brother    Diabetes Mellitus II Brother    Bipolar disorder Brother    Heart attack Brother    CAD Brother    Drug abuse Brother    Diabetes Mellitus II Mother    Bipolar disorder Cousin    Bipolar disorder Cousin    Bipolar disorder Cousin     Social History Social History   Tobacco Use   Smoking status: Never   Smokeless tobacco: Never  Vaping Use   Vaping Use: Never used  Substance Use Topics   Alcohol use: No   Drug use: No     Allergies   Aspirin, Pseudoephedrine, Nsaids, Amoxicillin, Butazolidin [phenylbutazone],  Diflunisal, Morphine and related, Sulfa antibiotics, Tramadol, and Omeprazole   Review of Systems Review of Systems  Skin:  Positive for wound.  All other systems reviewed and are negative.  See HPI for ROS  Physical Exam Updated Vital Signs BP (!) 142/92 (BP Location: Left Arm)   Pulse 94   Temp 98.3 F (36.8 C) (Oral)   Resp 18   SpO2 98%   Physical Exam Constitutional:      Appearance: Normal appearance.  HENT:     Head: Normocephalic and atraumatic.  Musculoskeletal:        General: No deformity or signs of injury.  Skin:    General: Skin is warm and dry.     Findings: Lesion present.  Comments: 1.5" x 1.5" square superficial partial thickness skin tear, wound bed red, moist, bleeding well controlled with pressure. No visible subcutaneous tissue, muscle, bone, signs of infection at or around site of injury. Located to the posterior aspect of medial forearm.  Neurological:     General: No focal deficit present.     Mental Status: She is alert and oriented to person, place, and time.     Sensory: No sensory deficit.     Motor: No weakness.      ED Treatments / Results  Labs (all labs ordered are listed, but only abnormal results are displayed) Labs Reviewed - No data to display  EKG  Radiology No results found.  Procedures Wound Care  Date/Time: 03/07/2022 5:46 PM  Performed by: Raylene Everts, MD Authorized by: Raylene Everts, MD   Consent:    Consent obtained:  Verbal   Consent given by:  Patient   Risks, benefits, and alternatives were discussed: yes     Risks discussed:  Bleeding, infection and pain Universal protocol:    Procedure explained and questions answered to patient or proxy's satisfaction: yes   Pre-procedure details:    Preparation: Patient was prepped and draped in usual sterile fashion   Sedation:    Sedation type:  None Anesthesia:    Anesthesia method:  Topical application   Topical anesthetic:  LET Procedure  details:    Indications: open wounds     Wound location:  Arm   Shoulder/arm location:  R lower arm   Wound age (days):  <1   Area debrided (cm2): See HPI.   Debridement performed: No   Dressing:    Dressing applied:  Tegaderm 2x3   Wrapped with:  Bulky dressing and Coban 2 inch Post-procedure details:    Procedure completion:  Tolerated well, no immediate complications Comments:     LET applied topically to wound for pain management, allowed to sit for 10 minutes. Irrigated wound with wound cleaning spray, manipulated skin flaps to cover as much of the wound bed as possible. Covered with tegaderm, gauze pressure dressing, coban. Instructions for wound care given on AVS.   (including critical care time)  Medications Ordered in ED Medications  lidocaine-EPINEPHrine-tetracaine (LET) topical gel (3 mLs Topical Given 03/07/22 1704)     Initial Impression / Assessment and Plan / ED Course  I have reviewed the triage vital signs and the nursing notes.  Pertinent labs & imaging results that were available during my care of the patient were reviewed by me and considered in my medical decision making (see chart for details).     Skin tear to posterior right arm has been cleaned and dressed today. Do not remove dressing for the next few days. Refer to AVS for detailed instructions. Return for new warmth, pain, redness, swelling, drainage at site. Follow up with PCP or here for any questions or concerns, head to emergency department for any new or life threatening conditions.  Final Clinical Impressions(s) / ED Diagnoses   Final diagnoses:  None    New Prescriptions New Prescriptions   No medications on file

## 2022-03-07 NOTE — Discharge Instructions (Signed)
Leave the bulky dressing on for 2 days.  Try to keep it dry.  On Thursday you may remove the outer dressing but leave the clear tape layer on the wound.  This should stay on for 5 to 7 days.  You may put a loose wrap or Band-Aid on top of it.  Try not to remove the clear dressing with activity or dressing change.  It does not need to be washed.  Call for signs of infection.  After 5 to 7 days you may gently soak off the clear dressing.  Call or return for problems

## 2022-03-07 NOTE — ED Provider Notes (Signed)
Keane Scrape, RN  Student-NP Specialty: Student   Medical Student Note     Signed   Date of Service: 03/07/2022  5:06 PM Procedure Orders  Wound Care [[id number]] ordered by Raylene Everts, MD       Signed     Expand All Collapse All     [facility] Provider Student Note For educational purposes for Medical, PA and NP students only and not part of the legal medical record.     CSN: [id number] Arrival date & time: 03/07/22  1638       History              Chief Complaint     Chief Complaint  Patient presents with   Arm Injury      RT, avulsion      HPI Anna Tanner is a 71 y.o. female.   Anna Tanner presents to the office today after sustaining a skin tear to her right posterior forearm this afternoon. States she was working a lot around [employer] today and while lifting a skillet from the stove to the sink, caught the back of her arm on the corner of the over-hanging cabinet. Last T-Dap was 10/19/2021. The wound appears as a superficial, partial thickness shearing of the epidermal layer from the dermal layer, no visible subcutaneous tissue. Wound bed appears red, moist, bleeding well controled. Wound is square in shape, 1.5 inches on all sides, two small skin flaps are adhered on two out of four edges. Patient appears to be tolerating pain well in office.     Arm Injury         Past Medical History:  Diagnosis Date   Asthma     Constipation     Sinusitis 03/02/11    affects all sinuses          Patient Active Problem List    Diagnosis Date Noted   Abnormal cervical Papanicolaou smear 01/04/2022   Degeneration of lumbar intervertebral disc 01/04/2022   Right leg swelling 10/27/2021   Occipital neuralgia 01/10/2021   Chronic pain of right knee 12/28/2020   Lumbar radiculopathy 12/28/2020   Numbness of right foot 12/28/2020   Family history of ovarian cancer 05/30/2020   OSA on CPAP 08/26/2019   Macrocytosis without anemia 04/18/2019    Thrombocytosis 03/12/2019   Cervical radiculitis 02/07/2019   Gastroesophageal reflux disease without esophagitis 12/31/2018   Elevated glucose 04/28/2018   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   Enthesopathy of right hip region 10/15/2017   Biceps tendinitis of right upper extremity 04/20/2017   Medial epicondylitis of right elbow 01/14/2016   Primary localized osteoarthritis of right knee 11/30/2015   Left elbow pain 08/24/2015   Joint pain in fingers of right hand 03/03/2014   Right wrist tendonitis 03/03/2014   Bipolar I disorder, most recent episode mixed, in partial remission ([place]) 02/12/2014   Depression 01/23/2014   Scoliosis 01/23/2014   Lumbosacral spondylosis 10/23/2013   Constipation 05/28/2013   Family history of colonic polyps 05/28/2013   IBS (irritable bowel syndrome) 05/28/2013   Dyslipidemia 12/01/2010   Atypical chest pain 12/01/2010   Fibromyalgia 04/22/1971           Past Surgical History:  Procedure Laterality Date   JOINT REPLACEMENT   09/29/2005    Left Hip replacement   MULTIPLE TOOTH EXTRACTIONS   11/20/2011    Two teeth.   TOTAL ABDOMINAL HYSTERECTOMY          OB History  No obstetric history on file.          Home Medications                       Prior to Admission medications   Medication Sig Start Date End Date Taking? Authorizing Provider  albuterol (VENTOLIN HFA) 108 (90 Base) MCG/ACT inhaler INHALE 2 PUFFS INTO LUNGS EVERY 4 TO 6 HOURS AS NEEDED FOR SHORTNESS OF BREATH FOR WHEEZING       [provider]  alendronate (FOSAMAX) 70 MG tablet Take 70 mg by mouth once a week. Take with a full glass of water on an empty stomach.       [provider]  Ascorbic Acid (VITAMIN C) 1000 MG tablet Take 1,000 mg by mouth daily.       [provider]  Biotin 5 MG TABS Take by mouth.       [provider]  buPROPion (WELLBUTRIN XL) 300 MG 24 hr tablet Take 1 tablet (300 mg total) by mouth daily. 02/01/22     Samuel Bouche,  NP  Cyanocobalamin (B-12) 1000 MCG CAPS 1 tablet by mouth every other day       [provider]  dexlansoprazole (DEXILANT) 60 MG capsule Take by mouth. 11/29/21     [provider]  fexofenadine (ALLEGRA) 180 MG tablet Take 180 mg by mouth daily.       [provider]  fluticasone (FLONASE) 50 MCG/ACT nasal spray USE 1 TO 2 SPRAY(S) IN EACH NOSTRIL ONCE DAILY       [provider]  linaclotide (LINZESS) 290 MCG CAPS capsule daily.       [provider]  lovastatin (MEVACOR) 20 MG tablet Take 1 tablet by mouth daily.       [provider]  Milnacipran HCl (SAVELLA) 100 MG TABS tablet Take by mouth 2 (two) times daily.       [provider]  montelukast (SINGULAIR) 10 MG tablet Take by mouth.       [provider]  Multiple Vitamins-Minerals (CENTRUM SILVER PO) 1 tablet Orally Once a day       [provider]  QUEtiapine (SEROQUEL) 200 MG tablet Take 1 tablet (200 mg total) by mouth at bedtime. Emergency supply - delay with mail order pharmacy. Please dispense. Thanks. 02/22/22     Samuel Bouche, NP  Tdap (ADACEL) 08-30-13.5 LF-MCG/0.5 injection         [provider]  traZODone (DESYREL) 150 MG tablet Take 2 tablets (300 mg total) by mouth at bedtime. 11/29/21     Samuel Bouche, NP  esomeprazole (NEXIUM) 40 MG capsule Take 40 mg by mouth daily before breakfast.   04/28/20   [provider]  methylphenidate (RITALIN) 10 MG tablet   05/15/11 07/14/11   [provider]      Family History      Family History  Problem Relation Age of Onset   Bipolar disorder Father     Suicidality Paternal Aunt     Depression Paternal Aunt     Suicidality Maternal Uncle     Bipolar disorder Maternal Grandmother     Bipolar disorder Paternal Grandfather     Bipolar disorder Brother     Diabetes Mellitus II Brother     Bipolar disorder Brother     Heart attack Brother     CAD Brother     Drug abuse Brother      Diabetes  Mellitus II Mother     Bipolar disorder Cousin     Bipolar disorder Cousin     Bipolar disorder Cousin        Social History Social History        Tobacco Use   Smoking status: Never   Smokeless tobacco: Never  Vaping Use   Vaping Use: Never used  Substance Use Topics   Alcohol use: No   Drug use: No        Allergies              Aspirin, Pseudoephedrine, Nsaids, Amoxicillin, Butazolidin [phenylbutazone], Diflunisal, Morphine and related, Sulfa antibiotics, Tramadol, and Omeprazole     Review of Systems Review of Systems  Skin:  Positive for wound.  All other systems reviewed and are negative.   See HPI for ROS   Physical Exam Updated Vital Signs BP (!) 142/92 (BP Location: Left Arm)   Pulse 94   Temp 98.3 F (36.8 C) (Oral)   Resp 18   SpO2 98%    Physical Exam Constitutional:      Appearance: Normal appearance.  HENT:     Head: Normocephalic and atraumatic.  Musculoskeletal:        General: No deformity or signs of injury.  Skin:    General: Skin is warm and dry.     Findings: Lesion present.     Comments: 1.5" x 1.5" square superficial partial thickness skin tear, wound bed red, moist, bleeding well controlled with pressure. No visible subcutaneous tissue, muscle, bone, signs of infection at or around site of injury. Located to the posterior aspect of medial forearm.  Neurological:     General: No focal deficit present.     Mental Status: She is alert and oriented to person, place, and time.     Sensory: No sensory deficit.     Motor: No weakness.          ED Treatments / Results  Labs (all labs ordered are listed, but only abnormal results are displayed) Labs Reviewed - No data to display   EKG   Radiology Imaging Results (Last 48 hours)  No results found.     Procedures Wound Care   Date/Time: 03/07/2022 5:46 PM   Performed by: Raylene Everts, MD Authorized by: Raylene Everts, MD   Consent:    Consent obtained:   Verbal   Consent given by:  Patient   Risks, benefits, and alternatives were discussed: yes     Risks discussed:  Bleeding, infection and pain Universal protocol:    Procedure explained and questions answered to patient or proxy's satisfaction: yes   Pre-procedure details:    Preparation: Patient was prepped and draped in usual sterile fashion   Sedation:    Sedation type:  None Anesthesia:    Anesthesia method:  Topical application   Topical anesthetic:  LET Procedure details:    Indications: open wounds     Wound location:  Arm   Shoulder/arm location:  R lower arm   Wound age (days):  <1   Area debrided (cm2): See HPI.   Debridement performed: No   Dressing:    Dressing applied:  Tegaderm 2x3   Wrapped with:  Bulky dressing and Coban 2 inch Post-procedure details:    Procedure completion:  Tolerated well, no immediate complications Comments:     LET applied topically to wound for pain management, allowed to sit for 10 minutes. Irrigated wound with wound cleaning spray, manipulated skin flaps  to cover as much of the wound bed as possible. Covered with tegaderm, gauze pressure dressing, coban. Instructions for wound care given on AVS.   (including critical care time)   Medications Ordered in ED Medications  lidocaine-EPINEPHrine-tetracaine (LET) topical gel (3 mLs Topical Given 03/07/22 1704)        Initial Impression / Assessment and Plan / ED Course  I have reviewed the triage vital signs and the nursing notes.   Pertinent labs & imaging results that were available during my care of the patient were reviewed by me and considered in my medical decision making (see chart for details).     Skin tear to posterior right arm has been cleaned and dressed today. Do not remove dressing for the next few days. Refer to AVS for detailed instructions. Return for new warmth, pain, redness, swelling, drainage at site. Follow up with PCP or here for any questions or concerns, head to  emergency department for any new or life threatening conditions.   Final Clinical Impressions(s) / ED Diagnoses    Final diagnoses:  Skin tear left forearm, initial visit      New Prescriptions    New Prescriptions    No medications on file             Electronically signed by Keane Scrape, RN at 03/07/2022  6:01 PM  Patient was examined and treated with student.  The evaluation, management, and documentation is all correct.  Lysle Morales MD  ED on 03/07/2022       Detailed Report    Clinical Impressions   Skin tear of forearm without complication, initial encounter Disposition   Discharge    ED After Visit Summary (Printed 03/07/2022) Care Timeline  1638   Arrived  1704   Lidocaine HCl,EPINEPHrine,Sodium... 3 mL  1744   Discharged  Soldier, Fleur Audino Sue, MD 03/07/22 1810

## 2022-03-07 NOTE — ED Triage Notes (Signed)
Pt c/o RT arm laceration/avulsion that occurred this evening in her kitchen. States she scraped her forearm on the corner of a cabinet.

## 2022-03-08 ENCOUNTER — Encounter: Payer: Self-pay | Admitting: Emergency Medicine

## 2022-03-08 ENCOUNTER — Ambulatory Visit
Admission: EM | Admit: 2022-03-08 | Discharge: 2022-03-08 | Disposition: A | Discharge status: Home / Self Care | Payer: Medicare HMO

## 2022-03-08 ENCOUNTER — Telehealth: Payer: Self-pay | Admitting: Emergency Medicine

## 2022-03-08 NOTE — ED Triage Notes (Signed)
Here for wound recheck & dressing change - pt had concerns due to bleeding under the dressing (opsite) no signs of infection - RN to redress

## 2022-03-08 NOTE — Telephone Encounter (Signed)
Spoke with patient, states that her arm did throb all night but better this morning.  Will follow up as needed.

## 2022-03-08 NOTE — Discharge Instructions (Signed)
Keep right arm elevated  Leave clear dressing in place as instructed  May change gauze & coban daily

## 2022-03-15 ENCOUNTER — Ambulatory Visit
Admission: EM | Admit: 2022-03-15 | Discharge: 2022-03-15 | Disposition: A | Discharge status: Home / Self Care | Payer: Medicare HMO | Attending: Family Medicine | Admitting: Family Medicine

## 2022-03-15 ENCOUNTER — Encounter: Payer: Self-pay | Admitting: Emergency Medicine

## 2022-03-15 DIAGNOSIS — S51811D Laceration without foreign body of right forearm, subsequent encounter: Secondary | ICD-10-CM | POA: Diagnosis not present

## 2022-03-15 NOTE — ED Triage Notes (Signed)
Here for a wound recheck - unable to remove opsite  at home after soaking Here w/ husband

## 2022-03-15 NOTE — ED Provider Notes (Signed)
[name]    CSN: [id number] Arrival date & time: 03/15/22  1052      History   Chief Complaint Chief Complaint  Patient presents with   Follow-up    HPI Anna Tanner is a 71 y.o. female.   HPI  Patient is here for wound check.  Questions had care for wound.  Otherwise well  Past Medical History:  Diagnosis Date   Asthma    Constipation    Sinusitis 03/02/11   affects all sinuses    Patient Active Problem List   Diagnosis Date Noted   Abnormal cervical Papanicolaou smear 01/04/2022   Degeneration of lumbar intervertebral disc 01/04/2022   Right leg swelling 10/27/2021   Occipital neuralgia 01/10/2021   Chronic pain of right knee 12/28/2020   Lumbar radiculopathy 12/28/2020   Numbness of right foot 12/28/2020   Family history of ovarian cancer 05/30/2020   OSA on CPAP 08/26/2019   Macrocytosis without anemia 04/18/2019   Thrombocytosis 03/12/2019   Cervical radiculitis 02/07/2019   Gastroesophageal reflux disease without esophagitis 12/31/2018   Elevated glucose 04/28/2018   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   Enthesopathy of right hip region 10/15/2017   Biceps tendinitis of right upper extremity 04/20/2017   Medial epicondylitis of right elbow 01/14/2016   Primary localized osteoarthritis of right knee 11/30/2015   Left elbow pain 08/24/2015   Joint pain in fingers of right hand 03/03/2014   Right wrist tendonitis 03/03/2014   Bipolar I disorder, most recent episode mixed, in partial remission ([place]) 02/12/2014   Depression 01/23/2014   Scoliosis 01/23/2014   Lumbosacral spondylosis 10/23/2013   Constipation 05/28/2013   Family history of colonic polyps 05/28/2013   IBS (irritable bowel syndrome) 05/28/2013   Dyslipidemia 12/01/2010   Atypical chest pain 12/01/2010   Fibromyalgia 04/22/1971    Past Surgical History:  Procedure Laterality Date   JOINT REPLACEMENT  09/29/2005   Left Hip replacement   MULTIPLE TOOTH EXTRACTIONS  11/20/2011    Two teeth.   TOTAL ABDOMINAL HYSTERECTOMY      OB History   No obstetric history on file.      Home Medications    Prior to Admission medications   Medication Sig Start Date End Date Taking? Authorizing Provider  albuterol (VENTOLIN HFA) 108 (90 Base) MCG/ACT inhaler INHALE 2 PUFFS INTO LUNGS EVERY 4 TO 6 HOURS AS NEEDED FOR SHORTNESS OF BREATH FOR WHEEZING    [provider]  alendronate (FOSAMAX) 70 MG tablet Take 70 mg by mouth once a week. Take with a full glass of water on an empty stomach.    [provider]  Ascorbic Acid (VITAMIN C) 1000 MG tablet Take 1,000 mg by mouth daily.    [provider]  Biotin 5 MG TABS Take by mouth.    [provider]  buPROPion (WELLBUTRIN XL) 300 MG 24 hr tablet Take 1 tablet (300 mg total) by mouth daily. 02/01/22   Samuel Bouche, NP  Cyanocobalamin (B-12) 1000 MCG CAPS 1 tablet by mouth every other day    [provider]  dexlansoprazole (DEXILANT) 60 MG capsule Take by mouth. 11/29/21   [provider]  fexofenadine (ALLEGRA) 180 MG tablet Take 180 mg by mouth daily.    [provider]  fluticasone (FLONASE) 50 MCG/ACT nasal spray USE 1 TO 2 SPRAY(S) IN EACH NOSTRIL ONCE DAILY    [provider]  linaclotide (LINZESS) 290 MCG CAPS capsule daily.    [provider]  lovastatin (  MEVACOR) 20 MG tablet Take 1 tablet by mouth daily.    [provider]  Milnacipran HCl (SAVELLA) 100 MG TABS tablet Take by mouth 2 (two) times daily.    [provider]  montelukast (SINGULAIR) 10 MG tablet Take by mouth.    [provider]  Multiple Vitamins-Minerals (CENTRUM SILVER PO) 1 tablet Orally Once a day    [provider]  QUEtiapine (SEROQUEL) 200 MG tablet Take 1 tablet (200 mg total) by mouth at bedtime. Emergency supply - delay with mail order pharmacy. Please dispense. Thanks. 02/22/22   Samuel Bouche, NP  traZODone (DESYREL) 150 MG tablet Take  2 tablets (300 mg total) by mouth at bedtime. 11/29/21   Samuel Bouche, NP  esomeprazole (NEXIUM) 40 MG capsule Take 40 mg by mouth daily before breakfast.  04/28/20  [provider]  methylphenidate (RITALIN) 10 MG tablet  05/15/11 07/14/11  [provider]    Family History Family History  Problem Relation Age of Onset   Bipolar disorder Father    Suicidality Paternal Aunt    Depression Paternal Aunt    Suicidality Maternal Uncle    Bipolar disorder Maternal Grandmother    Bipolar disorder Paternal Grandfather    Bipolar disorder Brother    Diabetes Mellitus II Brother    Bipolar disorder Brother    Heart attack Brother    CAD Brother    Drug abuse Brother    Diabetes Mellitus II Mother    Bipolar disorder Cousin    Bipolar disorder Cousin    Bipolar disorder Cousin     Social History Social History   Tobacco Use   Smoking status: Never   Smokeless tobacco: Never  Vaping Use   Vaping Use: Never used  Substance Use Topics   Alcohol use: No   Drug use: No     Allergies   Aspirin, Pseudoephedrine, Nsaids, Amoxicillin, Butazolidin [phenylbutazone], Diflunisal, Morphine and related, Sulfa antibiotics, Tramadol, and Omeprazole   Review of Systems Review of Systems Wound only  Physical Exam Triage Vital Signs ED Triage Vitals  Enc Vitals Group     BP 03/15/22 1107 129/85     Pulse Rate 03/15/22 1107 (!) 103     Resp 03/15/22 1107 18     Temp 03/15/22 1107 98.5 F (36.9 C)     Temp Source 03/15/22 1107 Oral     SpO2 03/15/22 1107 100 %     Weight --      Height --      Head Circumference --      Peak Flow --      Pain Score 03/15/22 1110 4     Pain Loc --      Pain Edu? --      Excl. in [place]? --    No data found.  Updated Vital Signs BP 129/85 (BP Location: Left Arm)   Pulse (!) 103   Temp 98.5 F (36.9 C) (Oral)   Resp 18   SpO2 100%       Physical Exam Constitutional:      General: She is not in acute distress.    Appearance:  She is well-developed.  HENT:     Head: Normocephalic and atraumatic.  Eyes:     Conjunctiva/sclera: Conjunctivae normal.     Pupils: Pupils are equal, round, and reactive to light.  Cardiovascular:     Rate and Rhythm: Normal rate.  Pulmonary:     Effort: Pulmonary effort is normal. No respiratory  distress.  Abdominal:     General: There is no distension.     Palpations: Abdomen is soft.  Musculoskeletal:        General: Normal range of motion.     Cervical back: Normal range of motion.  Skin:    General: Skin is warm and dry.     Comments: The skin tear on the right forearm is healing well.  A little bit of skin is peeling up but the rest of the flap appears to be adherent.  Wound care discussed.  Dressing placed  Neurological:     Mental Status: She is alert.      UC Treatments / Results  Labs (all labs ordered are listed, but only abnormal results are displayed) Labs Reviewed - No data to display  EKG   Radiology No results found.  Procedures Procedures (including critical care time)  Medications Ordered in UC Medications - No data to display  Initial Impression / Assessment and Plan / UC Course  I have reviewed the triage vital signs and the nursing notes.  Pertinent labs & imaging results that were available during my care of the patient were reviewed by me and considered in my medical decision making (see chart for details).      Final Clinical Impressions(s) / UC Diagnoses   Final diagnoses:  Laceration of right forearm, subsequent encounter     Discharge Instructions      Wash once a day.  Apply the petroleum gauze and a light wrap. Call for problems   ED Prescriptions   None    PDMP not reviewed this encounter.   Raylene Everts, MD 03/15/22 1200

## 2022-03-15 NOTE — Discharge Instructions (Signed)
Wash once a day.  Apply the petroleum gauze and a light wrap. Call for problems

## 2022-03-15 NOTE — ED Notes (Signed)
Opsite removed w/ adhesive remover - skin tear noted to bleed once opsite removed Dr Meda Coffee to assess wound

## 2022-04-05 ENCOUNTER — Ambulatory Visit: Payer: Medicare HMO

## 2022-04-11 ENCOUNTER — Ambulatory Visit: Payer: Medicare HMO | Admitting: Medical-Surgical

## 2022-07-24 ENCOUNTER — Ambulatory Visit: Payer: Medicare HMO | Admitting: Medical-Surgical

## 2022-08-18 LAB — LAB REPORT - SCANNED
A1c: 5.6
EGFR: 91

## 2022-08-26 ENCOUNTER — Ambulatory Visit
Admission: EM | Admit: 2022-08-26 | Discharge: 2022-08-26 | Disposition: A | Discharge status: Home / Self Care | Payer: Medicare Other | Attending: Family Medicine | Admitting: Family Medicine

## 2022-08-26 ENCOUNTER — Encounter: Payer: Self-pay | Admitting: Emergency Medicine

## 2022-08-26 DIAGNOSIS — S0993XA Unspecified injury of face, initial encounter: Secondary | ICD-10-CM | POA: Diagnosis not present

## 2022-08-26 MED ORDER — ACETAMINOPHEN-CODEINE 300-30 MG PO TABS: 1.0000 | ORAL_TABLET | Freq: Four times a day (QID) | ORAL | 0 refills | Status: DC | PRN

## 2022-08-26 MED ORDER — CLINDAMYCIN HCL 300 MG PO CAPS: 300.0000 mg | ORAL_CAPSULE | Freq: Three times a day (TID) | ORAL | 0 refills | Status: DC

## 2022-08-26 NOTE — ED Triage Notes (Signed)
Dry socket from left lower molar removed on 08/22/22 Pt's dentist was closed on Friday & does not have an after hours clinic Pt is taking antibiotic & doing salt water rinses  Pt is unable to take NSAIDs Will finish doxycycline today  Here w/ her husband

## 2022-08-26 NOTE — ED Provider Notes (Signed)
[facility]    CSN: [id number] Arrival date & time: 08/26/22  0948      History   Chief Complaint Chief Complaint  Patient presents with   Dental Pain    HPI Anna Tanner is a 72 y.o. female.   HPI  Patient had a tooth pulled on 08/22/2022.  She states that at first she did well, then developed increasing pain.  She states she was up last night with dental pain.  She states that her dentist is not available on weekends.  She completed a few days of doxycycline as an antibiotic.  She has multiple drug allergies including penicillins.  Past Medical History:  Diagnosis Date   Asthma    Constipation    Sinusitis 03/02/11   affects all sinuses    Patient Active Problem List   Diagnosis Date Noted   Abnormal cervical Papanicolaou smear 01/04/2022   Degeneration of lumbar intervertebral disc 01/04/2022   Right leg swelling 10/27/2021   Occipital neuralgia 01/10/2021   Chronic pain of right knee 12/28/2020   Lumbar radiculopathy 12/28/2020   Numbness of right foot 12/28/2020   Family history of ovarian cancer 05/30/2020   OSA on CPAP 08/26/2019   Macrocytosis without anemia 04/18/2019   Thrombocytosis 03/12/2019   Cervical radiculitis 02/07/2019   Gastroesophageal reflux disease without esophagitis 12/31/2018   Elevated glucose 04/28/2018   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   Enthesopathy of right hip region 10/15/2017   Biceps tendinitis of right upper extremity 04/20/2017   Medial epicondylitis of right elbow 01/14/2016   Primary localized osteoarthritis of right knee 11/30/2015   Left elbow pain 08/24/2015   Joint pain in fingers of right hand 03/03/2014   Right wrist tendonitis 03/03/2014   Bipolar I disorder, most recent episode mixed, in partial remission (HCC) 02/12/2014   Depression 01/23/2014   Scoliosis 01/23/2014   Lumbosacral spondylosis 10/23/2013   Constipation 05/28/2013   Family history of colonic polyps 05/28/2013   IBS (irritable bowel  syndrome) 05/28/2013   Dyslipidemia 12/01/2010   Atypical chest pain 12/01/2010   Fibromyalgia 04/22/1971    Past Surgical History:  Procedure Laterality Date   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   TOTAL ABDOMINAL HYSTERECTOMY      OB History   No obstetric history on file.      Home Medications    Prior to Admission medications   Medication Sig Start Date End Date Taking? Authorizing Provider  acetaminophen-codeine (TYLENOL #3) 300-30 MG tablet Take 1-2 tablets by mouth every 6 (six) hours as needed for moderate pain. 08/26/22  Yes Eustace Moore, MD  clindamycin (CLEOCIN) 300 MG capsule Take 1 capsule (300 mg total) by mouth 3 (three) times daily. 08/26/22  Yes Eustace Moore, MD  albuterol (VENTOLIN HFA) 108 (90 Base) MCG/ACT inhaler INHALE 2 PUFFS INTO LUNGS EVERY 4 TO 6 HOURS AS NEEDED FOR SHORTNESS OF BREATH FOR WHEEZING    [provider]  alendronate (FOSAMAX) 70 MG tablet Take 70 mg by mouth once a week. Take with a full glass of water on an empty stomach.    [provider]  Ascorbic Acid (VITAMIN C) 1000 MG tablet Take 1,000 mg by mouth daily.    [provider]  Biotin 5 MG TABS Take by mouth.    [provider]  buPROPion (WELLBUTRIN XL) 300 MG 24 hr tablet Take 1 tablet (300 mg total)  by mouth daily. 02/01/22   Christen Butter, NP  Cyanocobalamin (B-12) 1000 MCG CAPS 1 tablet by mouth every other day    [provider]  dexlansoprazole (DEXILANT) 60 MG capsule Take by mouth. 11/29/21   [provider]  fexofenadine (ALLEGRA) 180 MG tablet Take 180 mg by mouth daily.    [provider]  fluticasone (FLONASE) 50 MCG/ACT nasal spray USE 1 TO 2 SPRAY(S) IN EACH NOSTRIL ONCE DAILY    [provider]  linaclotide (LINZESS) 290 MCG CAPS capsule daily.    [provider]  lovastatin (MEVACOR) 20 MG tablet Take 1 tablet by  mouth daily.    [provider]  Milnacipran HCl (SAVELLA) 100 MG TABS tablet Take by mouth 2 (two) times daily.    [provider]  montelukast (SINGULAIR) 10 MG tablet Take by mouth.    [provider]  Multiple Vitamins-Minerals (CENTRUM SILVER PO) 1 tablet Orally Once a day    [provider]  QUEtiapine (SEROQUEL) 200 MG tablet Take 1 tablet (200 mg total) by mouth at bedtime. Emergency supply - delay with mail order pharmacy. Please dispense. Thanks. 02/22/22   Christen Butter, NP  traZODone (DESYREL) 150 MG tablet Take 2 tablets (300 mg total) by mouth at bedtime. 11/29/21   Christen Butter, NP  esomeprazole (NEXIUM) 40 MG capsule Take 40 mg by mouth daily before breakfast.  04/28/20  [provider]  methylphenidate (RITALIN) 10 MG tablet  05/15/11 07/14/11  [provider]    Family History Family History  Problem Relation Age of Onset   Bipolar disorder Father    Suicidality Paternal Aunt    Depression Paternal Aunt    Suicidality Maternal Uncle    Bipolar disorder Maternal Grandmother    Bipolar disorder Paternal Grandfather    Bipolar disorder Brother    Diabetes Mellitus II Brother    Bipolar disorder Brother    Heart attack Brother    CAD Brother    Drug abuse Brother    Diabetes Mellitus II Mother    Bipolar disorder Cousin    Bipolar disorder Cousin    Bipolar disorder Cousin     Social History Social History   Tobacco Use   Smoking status: Never   Smokeless tobacco: Never  Vaping Use   Vaping Use: Never used  Substance Use Topics   Alcohol use: No   Drug use: No     Allergies   Aspirin, Pseudoephedrine, Nsaids, Amoxicillin, Butazolidin [phenylbutazone], Diflunisal, Morphine and related, Sulfa antibiotics, Tramadol, and Omeprazole   Review of Systems Review of Systems See HPI  Physical Exam Triage Vital Signs ED Triage Vitals  Enc Vitals Group     BP 08/26/22 1000 127/87     Pulse Rate 08/26/22 1000 84      Resp 08/26/22 1000 16     Temp 08/26/22 1000 98.4 F (36.9 C)     Temp Source 08/26/22 1000 Oral     SpO2 08/26/22 1000 98 %     Weight 08/26/22 1002 141 lb 1.5 oz (64 kg)     Height --      Head Circumference --      Peak Flow --      Pain Score 08/26/22 1001 10     Pain Loc --      Pain Edu? --      Excl. in GC? --    No data found.  Updated Vital Signs BP 127/87 (BP Location: Left Arm)  Pulse 84   Temp 98.4 F (36.9 C) (Oral)   Resp 16   Wt 64 kg   SpO2 98%   BMI 24.22 kg/m      Physical Exam Constitutional:      General: She is not in acute distress.    Appearance: She is well-developed.     Comments: Appears uncomfortable  HENT:     Head: Normocephalic and atraumatic.     Mouth/Throat:     Comments: Lower right jaw with inflamed area and open socket Eyes:     Conjunctiva/sclera: Conjunctivae normal.     Pupils: Pupils are equal, round, and reactive to light.  Cardiovascular:     Rate and Rhythm: Normal rate.  Pulmonary:     Effort: Pulmonary effort is normal. No respiratory distress.  Abdominal:     General: There is no distension.     Palpations: Abdomen is soft.  Musculoskeletal:        General: Normal range of motion.     Cervical back: Normal range of motion.  Lymphadenopathy:     Cervical: Cervical adenopathy present.  Skin:    General: Skin is warm and dry.  Neurological:     Mental Status: She is alert.      UC Treatments / Results  Labs (all labs ordered are listed, but only abnormal results are displayed) Labs Reviewed - No data to display  EKG   Radiology No results found.  Procedures Procedures (including critical care time)  Medications Ordered in UC Medications - No data to display  Initial Impression / Assessment and Plan / UC Course  I have reviewed the triage vital signs and the nursing notes.  Pertinent labs & imaging results that were available during my care of the patient were reviewed by me and considered  in my medical decision making (see chart for details).     Will treat with clindamycin since my experience is better with dental infections.  And pain management to get her through the weekend until she can have follow-up dental care. Final Clinical Impressions(s) / UC Diagnoses   Final diagnoses:  Pain due to dental trauma     Discharge Instructions      I am changing you to a different antibiotic that is good for dental infections.  Take clindamycin 3 times a day with food I have given you a prescription for Tylenol with codeine to take when pain is severe See your dentist Monday   ED Prescriptions     Medication Sig Dispense Auth. Provider   clindamycin (CLEOCIN) 300 MG capsule Take 1 capsule (300 mg total) by mouth 3 (three) times daily. 15 capsule Eustace Moore, MD   acetaminophen-codeine (TYLENOL #3) 300-30 MG tablet Take 1-2 tablets by mouth every 6 (six) hours as needed for moderate pain. 10 tablet Eustace Moore, MD      I have reviewed the PDMP during this encounter.   Eustace Moore, MD 08/26/22 [phone]

## 2022-08-26 NOTE — Discharge Instructions (Signed)
I am changing you to a different antibiotic that is good for dental infections.  Take clindamycin 3 times a day with food I have given you a prescription for Tylenol with codeine to take when pain is severe See your dentist Monday

## 2022-09-19 ENCOUNTER — Ambulatory Visit (INDEPENDENT_AMBULATORY_CARE_PROVIDER_SITE_OTHER): Payer: Medicare Other | Admitting: Family Medicine

## 2022-09-19 ENCOUNTER — Encounter: Payer: Self-pay | Admitting: Family Medicine

## 2022-09-19 VITALS — BP 116/70 | HR 90 | Temp 97.9°F | Resp 18 | Ht 65.0 in | Wt 146.5 lb

## 2022-09-19 DIAGNOSIS — G894 Chronic pain syndrome: Secondary | ICD-10-CM | POA: Insufficient documentation

## 2022-09-19 DIAGNOSIS — M461 Sacroiliitis, not elsewhere classified: Secondary | ICD-10-CM | POA: Insufficient documentation

## 2022-09-19 DIAGNOSIS — M797 Fibromyalgia: Secondary | ICD-10-CM | POA: Diagnosis not present

## 2022-09-19 DIAGNOSIS — Z7689 Persons encountering health services in other specified circumstances: Secondary | ICD-10-CM

## 2022-09-19 DIAGNOSIS — E876 Hypokalemia: Secondary | ICD-10-CM | POA: Diagnosis not present

## 2022-09-19 DIAGNOSIS — F3177 Bipolar disorder, in partial remission, most recent episode mixed: Secondary | ICD-10-CM

## 2022-09-19 DIAGNOSIS — Z9189 Other specified personal risk factors, not elsewhere classified: Secondary | ICD-10-CM

## 2022-09-19 DIAGNOSIS — M722 Plantar fascial fibromatosis: Secondary | ICD-10-CM | POA: Insufficient documentation

## 2022-09-19 MED ORDER — QUETIAPINE FUMARATE 200 MG PO TABS
200.0000 mg | ORAL_TABLET | Freq: Every day | ORAL | 0 refills | Status: AC
Start: 2022-09-19 — End: ?

## 2022-09-19 MED ORDER — TRAZODONE HCL 150 MG PO TABS
150.0000 mg | ORAL_TABLET | Freq: Every day | ORAL | 1 refills | Status: AC
Start: 2022-09-19 — End: ?

## 2022-09-19 MED ORDER — BUPROPION HCL ER (XL) 300 MG PO TB24: 300.0000 mg | ORAL_TABLET | Freq: Every day | ORAL | 0 refills | Status: DC

## 2022-09-19 NOTE — Patient Instructions (Addendum)
Discuss Savella with Neurologist for Fibromyalgia as this can interact with Trazodone.  Cut the Trazodone 150mg  at bedtime instead of 2 tabs at night. Change the Robaxin to 2x a day as needed but take daily. Will continue to adjust medicines and will see back in 4 weeks.

## 2022-09-19 NOTE — Progress Notes (Signed)
New Patient Office Visit  Subjective    Patient ID: Anna Tanner, female    DOB: 1950-07-24  Age: 72 y.o. MRN: [id number]  CC:  Chief Complaint  Patient presents with   Establish Care    New Patient is here to establish care with new provider , Patient states that she is her today for a medication review    HPI Anna Tanner presents to establish care. Pt is new to me, with husband.  Pt reports a hx of bipolar depression 15 years ago. She reports she use to see a mental health provider She is taking Wellbutrin 300mg  daily and Seroquel 200mg  QHS along with Trazodone 150mg  2 tabs po QHS. She reports this dose has been stable. She has had these dosages changed over the years. She reports this current combination works well. She use to have a psychiatrist in [place] that has released her since she was doing so well.   She is established with neurologist for Fibromyalgia. She is taking Savella 100mg  BID.   She is established with pain clinic in [place] for trigger point injections for Fibromyalgia along with Robaxin 750mg  bid.   She has HLD and using Lovastatin 20mg  daily.  She has IBS-mixed and using Linzess 290 mcg. She has GI provider that she sees for this that referred her to colon specialist. She had a capsule study due to the constipation. She has follow up next month with colon specialist. She is also using Dexilant 60 mg for GERD.  She is using multivitamins for anemia.  Pt has seasonal allergies. She is using Allegra and Singulair every other day.   Pt has OSA and on CPAP. She is having a new sleep study with Pulmonology at the end of May.   She reports going to ER in January for diarrhea. Was noted to have hypokalemia. Received replacement in the ER and wondered if she needs this rechecked.   Outpatient Encounter Medications as of 09/19/2022  Medication Sig   acetaminophen-codeine (TYLENOL #3) 300-30 MG tablet Take 1-2 tablets by mouth every 6 (six) hours as  needed for moderate pain.   albuterol (VENTOLIN HFA) 108 (90 Base) MCG/ACT inhaler INHALE 2 PUFFS INTO LUNGS EVERY 4 TO 6 HOURS AS NEEDED FOR SHORTNESS OF BREATH FOR WHEEZING   alendronate (FOSAMAX) 70 MG tablet Take 70 mg by mouth once a week. Take with a full glass of water on an empty stomach.   Ascorbic Acid (VITAMIN C) 1000 MG tablet Take 1,000 mg by mouth daily.   azelastine (ASTELIN) 0.1 % nasal spray Place into both nostrils 2 (two) times daily. Use in each nostril as directed   Biotin 78295 MCG TABS Take 10,000 mcg by mouth once.   buPROPion (WELLBUTRIN XL) 300 MG 24 hr tablet Take 1 tablet (300 mg total) by mouth daily.   Calcium Citrate 250 MG TABS Take 250 mg by mouth in the morning, at noon, and at bedtime.   Cholecalciferol (VITAMIN D-3) 125 MCG (5000 UT) TABS Take 125 mcg by mouth once.   clindamycin (CLEOCIN) 300 MG capsule Take 1 capsule (300 mg total) by mouth 3 (three) times daily.   Cyanocobalamin (B-12) 1000 MCG CAPS 1 tablet by mouth every other day   dexlansoprazole (DEXILANT) 60 MG capsule Take by mouth.   fexofenadine (ALLEGRA) 180 MG tablet Take 180 mg by mouth daily.   fluticasone (FLONASE) 50 MCG/ACT nasal spray USE 1 TO 2 SPRAY(S) IN EACH NOSTRIL ONCE DAILY   linaclotide (  LINZESS) 290 MCG CAPS capsule daily.   lovastatin (MEVACOR) 20 MG tablet Take 1 tablet by mouth daily.   methocarbamol (ROBAXIN) 750 MG tablet Take 750 mg by mouth 4 (four) times daily.   Milnacipran HCl (SAVELLA) 100 MG TABS tablet Take by mouth 2 (two) times daily.   montelukast (SINGULAIR) 10 MG tablet Take by mouth.   Multiple Vitamins-Minerals (CENTRUM SILVER PO) 1 tablet Orally Once a day   QUEtiapine (SEROQUEL) 200 MG tablet Take 1 tablet (200 mg total) by mouth at bedtime. Emergency supply - delay with mail order pharmacy. Please dispense. Thanks.   traZODone (DESYREL) 150 MG tablet Take 2 tablets (300 mg total) by mouth at bedtime.   Biotin 5 MG TABS Take by mouth.   [DISCONTINUED]  esomeprazole (NEXIUM) 40 MG capsule Take 40 mg by mouth daily before breakfast.   [DISCONTINUED] methylphenidate (RITALIN) 10 MG tablet    No facility-administered encounter medications on file as of 09/19/2022.    Past Medical History:  Diagnosis Date   Asthma    Constipation    Sinusitis 03/02/11   affects all sinuses    Past Surgical History:  Procedure Laterality Date   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   TOTAL ABDOMINAL HYSTERECTOMY      Family History  Problem Relation Age of Onset   Bipolar disorder Father    Suicidality Paternal Aunt    Depression Paternal Aunt    Suicidality Maternal Uncle    Bipolar disorder Maternal Grandmother    Bipolar disorder Paternal Grandfather    Bipolar disorder Brother    Diabetes Mellitus II Brother    Bipolar disorder Brother    Heart attack Brother    CAD Brother    Drug abuse Brother    Diabetes Mellitus II Mother    Bipolar disorder Cousin    Bipolar disorder Cousin    Bipolar disorder Cousin     Social History   Socioeconomic History   Marital status: Married    Spouse name: Fayrene Fearing   Number of children: 0   Years of education: 12   Highest education level: 12th grade  Occupational History   Occupation: Retired.   Occupation: self-employed  Tobacco Use   Smoking status: Never    Passive exposure: Never   Smokeless tobacco: Never  Vaping Use   Vaping Use: Never used  Substance and Sexual Activity   Alcohol use: No   Drug use: No   Sexual activity: Yes    Partners: Male  Other Topics Concern   Not on file  Social History Narrative   Lives with her husband. She enjoys gardening.   Social Determinants of Health   Financial Resource Strain: Low Risk  (12/06/2021)   Overall Financial Resource Strain (CARDIA)    Difficulty of Paying Living Expenses: Not hard at all  Food Insecurity: No Food Insecurity (12/06/2021)   Hunger Vital Sign     Worried About Running Out of Food in the Last Year: Never true    Ran Out of Food in the Last Year: Never true  Transportation Needs: No Transportation Needs (12/06/2021)   PRAPARE - Administrator, Civil Service (Medical): No    Lack of Transportation (Non-Medical): No  Physical Activity: Inactive (12/06/2021)   Exercise Vital Sign    Days of Exercise per Week: 0 days    Minutes of Exercise per Session: 0 min  Stress:  No Stress Concern Present (12/06/2021)   [employer] of Occupational Health - Occupational Stress Questionnaire    Feeling of Stress : Not at all  Social Connections: Socially Integrated (12/06/2021)   Social Connection and Isolation Panel [NHANES]    Frequency of Communication with Friends and Family: More than three times a week    Frequency of Social Gatherings with Friends and Family: More than three times a week    Attends Religious Services: More than 4 times per year    Active Member of [employer] or Organizations: Yes    Attends Engineer, structural: More than 4 times per year    Marital Status: Married  Catering manager Violence: Not At Risk (12/06/2021)   Humiliation, Afraid, Rape, and Kick questionnaire    Fear of Current or Ex-Partner: No    Emotionally Abused: No    Physically Abused: No    Sexually Abused: No    Review of Systems  All other systems reviewed and are negative.      Objective    BP 116/70   Pulse 90   Temp 97.9 F (36.6 C) (Oral)   Resp 18   Ht 5\' 5"  (1.651 m)   Wt 146 lb 8 oz (66.5 kg)   SpO2 98%   BMI 24.38 kg/m   Physical Exam Vitals and nursing note reviewed.  Constitutional:      Appearance: Normal appearance. She is normal weight.  HENT:     Head: Normocephalic and atraumatic.     Right Ear: External ear normal.     Left Ear: External ear normal.     Nose: Nose normal.     Mouth/Throat:     Mouth: Mucous membranes are moist.     Pharynx: Oropharynx is clear.  Eyes:     Conjunctiva/sclera:  Conjunctivae normal.     Pupils: Pupils are equal, round, and reactive to light.  Cardiovascular:     Rate and Rhythm: Normal rate and regular rhythm.     Pulses: Normal pulses.     Heart sounds: Normal heart sounds.  Pulmonary:     Effort: Pulmonary effort is normal.     Breath sounds: Normal breath sounds.  Abdominal:     General: Abdomen is flat. Bowel sounds are normal.  Skin:    General: Skin is warm.     Capillary Refill: Capillary refill takes less than 2 seconds.  Neurological:     General: No focal deficit present.     Mental Status: She is alert and oriented to person, place, and time. Mental status is at baseline.  Psychiatric:        Mood and Affect: Mood normal.        Behavior: Behavior normal.        Thought Content: Thought content normal.        Judgment: Judgment normal.       Assessment & Plan:   Problem List Items Addressed This Visit   None Medicare annual wellness visit, initial  Bipolar I disorder, most recent episode mixed, in partial remission (HCC) -     buPROPion HCl ER (XL); Take 1 tablet (300 mg total) by mouth daily.  Dispense: 90 tablet; Refill: 0 -     QUEtiapine Fumarate; Take 1 tablet (200 mg total) by mouth at bedtime.  Dispense: 90 tablet; Refill: 0  Fibromyalgia  Bipolar 1 disorder, mixed, moderate (HCC)   Medications reviewed with pt today. Refilled bipolar medicines. See back in 3 months.  Have advised pt that I'll refill as long as she continues to be stable.  She has a fibromyalgia provider and discussed with pt the potential risk of Savella with Trazodone. Have also advised pt to cut her Robaxin daily prn instead of 3 times a day scheduled.  Will work on cutting some of her medicines due to her being at risk for polypharmacy.  Pt and husband is in agreement.   Total time spent with patient today 47 minutes. This includes reviewing records, evaluating the patient and coordinating care. Face-to-face time >50%.    No follow-ups on  file.   Suzan Slick, MD

## 2022-09-20 LAB — BASIC METABOLIC PANEL
BUN/Creatinine Ratio: 21 (ref 12–28)
BUN: 15 mg/dL (ref 8–27)
CO2: 23 mmol/L (ref 20–29)
Calcium: 9.4 mg/dL (ref 8.7–10.3)
Chloride: 104 mmol/L (ref 96–106)
Creatinine, Ser: 0.71 mg/dL (ref 0.57–1.00)
Glucose: 87 mg/dL (ref 70–99)
Potassium: 4.2 mmol/L (ref 3.5–5.2)
Sodium: 141 mmol/L (ref 134–144)
eGFR: 91 mL/min/{1.73_m2} (ref >59)

## 2022-10-17 ENCOUNTER — Ambulatory Visit: Payer: Medicare Other | Admitting: Family Medicine

## 2022-10-18 ENCOUNTER — Encounter: Payer: Self-pay | Admitting: Family Medicine

## 2022-10-18 ENCOUNTER — Ambulatory Visit (INDEPENDENT_AMBULATORY_CARE_PROVIDER_SITE_OTHER): Payer: Medicare Other | Admitting: Family Medicine

## 2022-10-18 VITALS — BP 112/71 | HR 111 | Temp 97.6°F | Resp 18 | Ht 65.0 in | Wt 145.7 lb

## 2022-10-18 DIAGNOSIS — F3177 Bipolar disorder, in partial remission, most recent episode mixed: Secondary | ICD-10-CM

## 2022-10-18 NOTE — Progress Notes (Signed)
   Established Patient Office Visit  Subjective   Patient ID: Anna Tanner, female    DOB: 12/05/1950  Age: 72 y.o. MRN: [id number]  Chief Complaint  Patient presents with   Follow-up    HPI  Pt reports she didn't sleep well when cutting back on the Trazodone from 300mg  to 150mg . She also takes the Seroquel 200mg  and Trazodone 300mg  but was concerned about polypharmacy and potential side effects. Pt reports I won't change her medicines and said she will go back to see her psychiatrist. She then said it won't be any point for me to be her PCP.    ROS    Objective:     BP 112/71   Pulse (!) 111   Temp 97.6 F (36.4 C) (Oral)   Resp 18   Ht 5\' 5"  (1.651 m)   Wt 145 lb 11.2 oz (66.1 kg)   SpO2 99%   BMI 24.25 kg/m    Physical Exam   No results found for any visits on 10/18/22.    The 10-year ASCVD risk score (Arnett DK, et al., 2019) is: 7.7%    Assessment & Plan:   Problem List Items Addressed This Visit       Other   Bipolar I disorder, most recent episode mixed, in partial remission (HCC) - Primary   Bipolar I disorder, most recent episode mixed, in partial remission (HCC)    No follow-ups on file.   No further follow ups scheduled. Pt was upset about advice on medication changes despite being in agreement the first visit with me. She stated she will go back to see her Psych provider for medicines and there was no need to see me.  Suzan Slick, MD

## 2022-12-12 ENCOUNTER — Other Ambulatory Visit: Payer: Self-pay | Admitting: Family Medicine

## 2022-12-12 DIAGNOSIS — F3177 Bipolar disorder, in partial remission, most recent episode mixed: Secondary | ICD-10-CM

## 2022-12-20 ENCOUNTER — Other Ambulatory Visit: Payer: Self-pay | Admitting: Family Medicine

## 2022-12-20 DIAGNOSIS — F3177 Bipolar disorder, in partial remission, most recent episode mixed: Secondary | ICD-10-CM

## 2023-02-01 IMAGING — CT CT CHEST W/O CM
2 of 4 series · 15 of 36 positions shown, 18 images · non-contrast
Comparison: CT chest 11/29/2020.  Radiographs 05/05/2020.

CLINICAL DATA: Follow up pulmonary nodules. No given history of
malignancy.

EXAM:
CT CHEST WITHOUT CONTRAST
TECHNIQUE: Multidetector CT imaging of the chest was performed following the
standard protocol without IV contrast.

[Series 2: thorax · axial · 0.63mm/px · z∈[-284,-54]mm · 12 of 137 slices shown, 15 images]
[im 11/137  mediastinal]
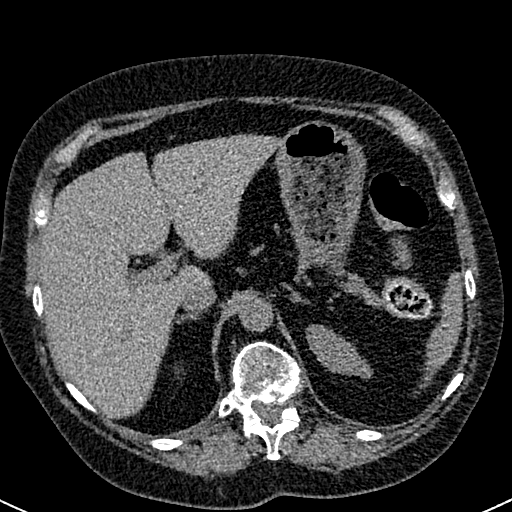
[im 11/137  lung]
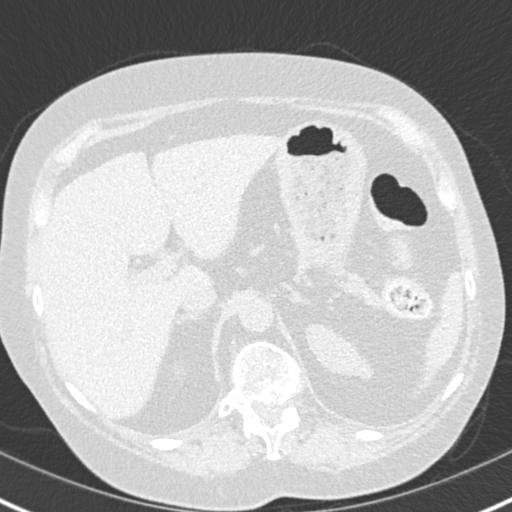
[im 21/137  lung]
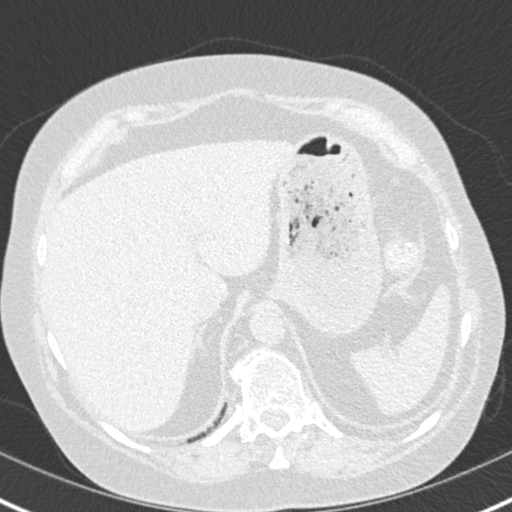
[im 32/137  lung]
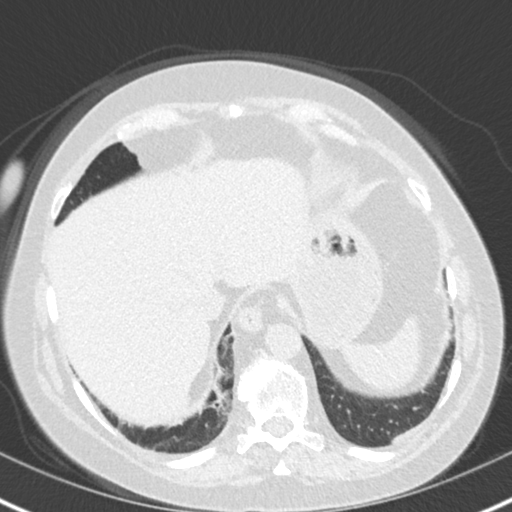
[im 42/137  lung]
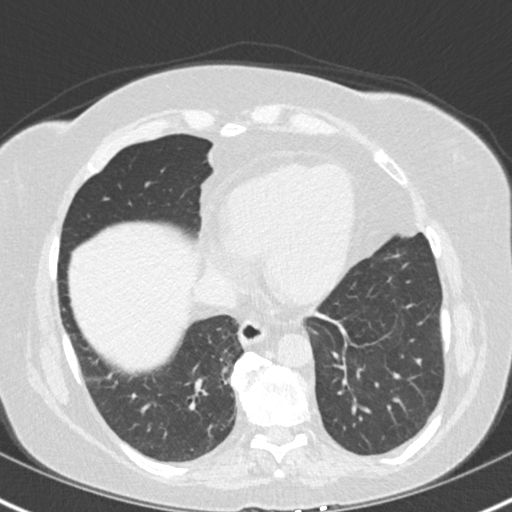
[im 53/137  mediastinal]
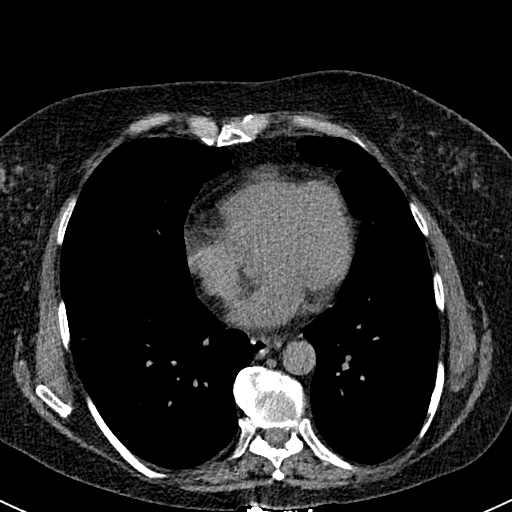
[im 53/137  lung]
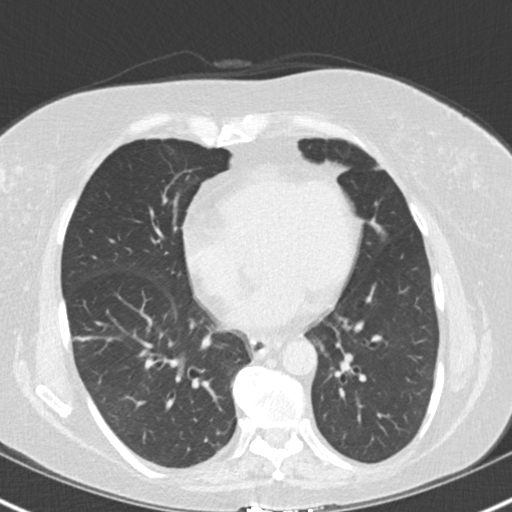
[im 63/137  lung]
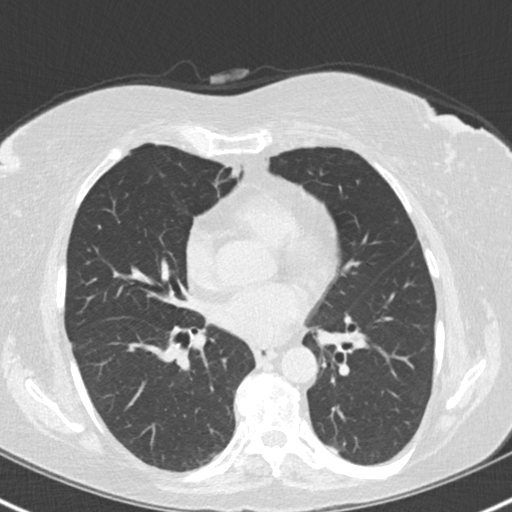
[im 74/137  lung]
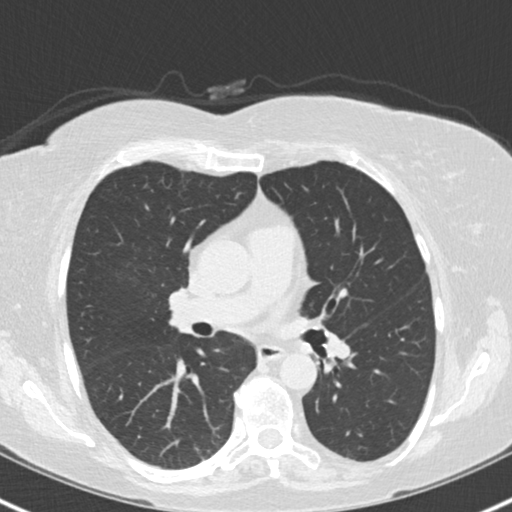
[im 84/137  lung]
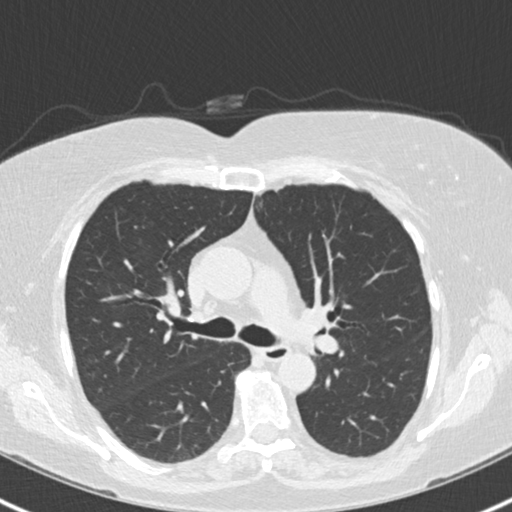
[im 95/137  mediastinal]
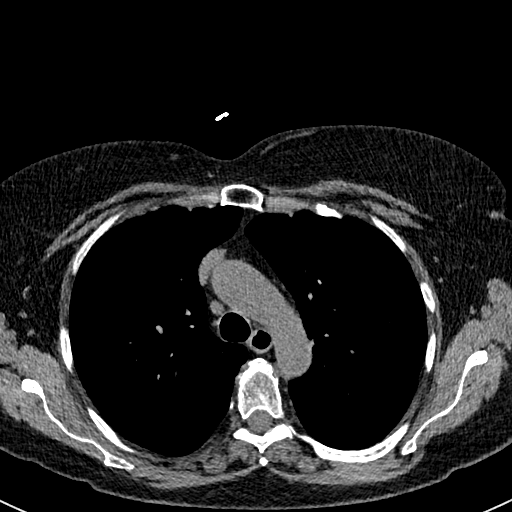
[im 95/137  lung]
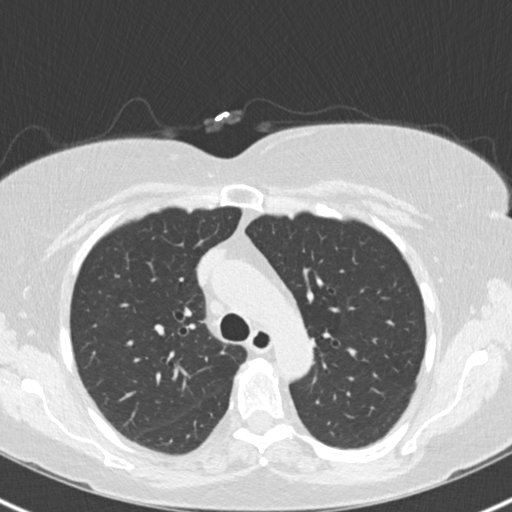
[im 105/137  lung]
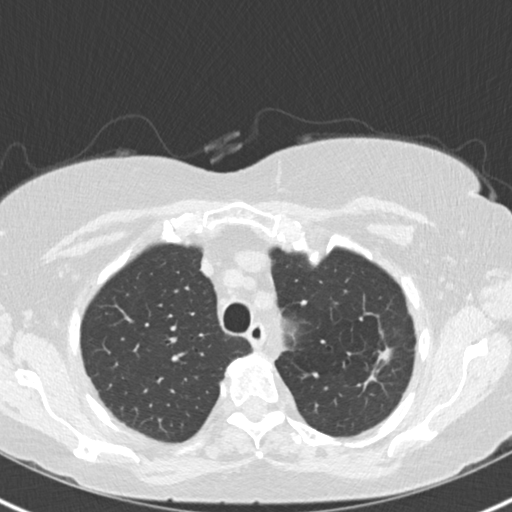
[im 116/137  lung]
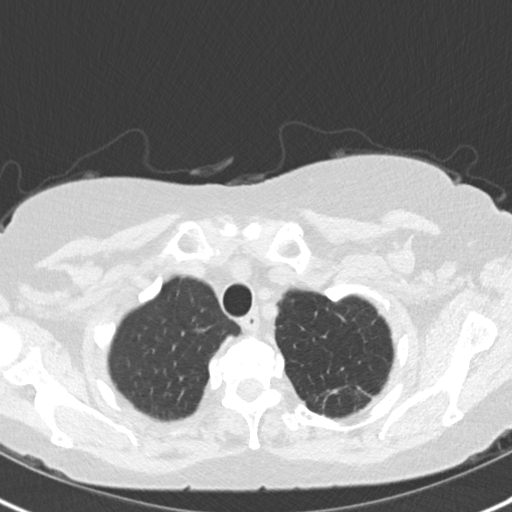
[im 126/137  lung]
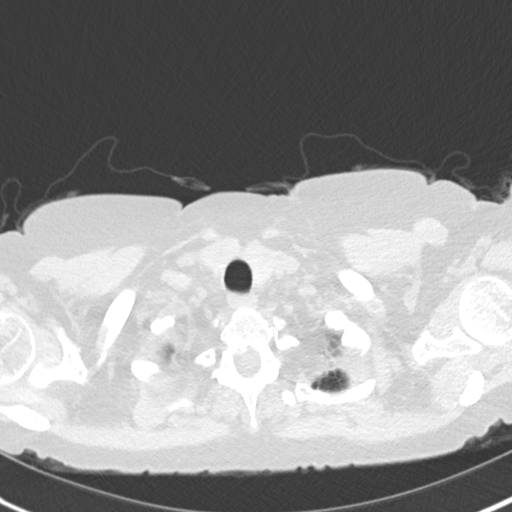

[Series 5: coronal · coronal · 0.59mm/px · 3 of 125 slices shown]
[im 25/125  lung]
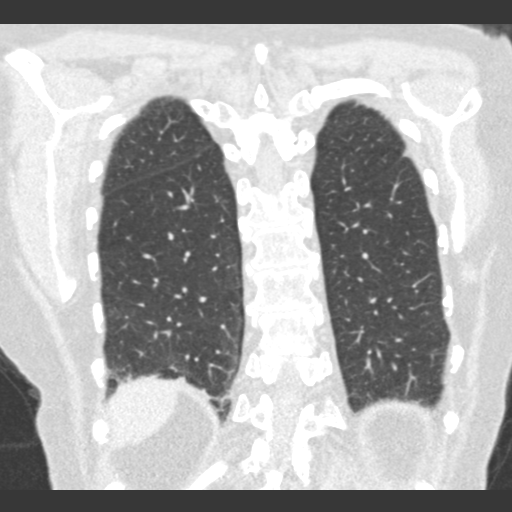
[im 50/125  lung]
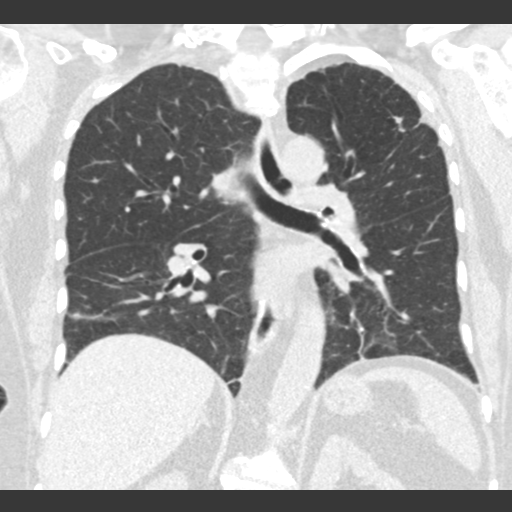
[im 75/125  lung]
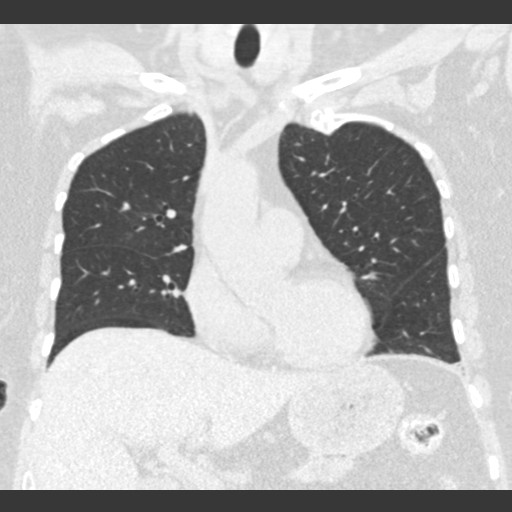

[15 of 36 positions shown; findings below may reference images not displayed]

FINDINGS: Cardiovascular: No significant vascular findings on noncontrast
imaging. The heart size is normal. There is no pericardial effusion.

Mediastinum/Nodes: There are no enlarged mediastinal, hilar or
axillary lymph nodes.Small mediastinal lymph nodes are unchanged.
Hilar assessment is limited by the lack of intravenous contrast,
although the hilar contours appear unchanged. The thyroid gland,
trachea and esophagus demonstrate no significant findings.

Lungs/Pleura: No pleural effusion or pneumothorax. Mild
centrilobular emphysema with stable scattered linear pulmonary
scarring. The nodular component associated with the bandlike
scarring in the left upper lobe is unchanged. No suspicious
pulmonary nodules.

Upper abdomen: The visualized upper abdomen appears stable without
acute findings. There is mild nodularity of the hepatic contours
which could reflect mild cirrhosis.

Musculoskeletal/Chest wall: There is no chest wall mass or
suspicious osseous finding. Mild multilevel spondylosis.
IMPRESSION: 1. Stable appearance of the lungs with scattered parenchymal
scarring bilaterally, including the bandlike and nodular components
in the left upper lobe seen on previous study. No suspicious
pulmonary nodules.
2. No acute chest findings.
3. Possible changes of mild hepatic cirrhosis.

## 2023-02-28 ENCOUNTER — Other Ambulatory Visit: Payer: Self-pay | Admitting: Family Medicine

## 2023-02-28 DIAGNOSIS — F3177 Bipolar disorder, in partial remission, most recent episode mixed: Secondary | ICD-10-CM

## 2023-04-18 ENCOUNTER — Telehealth: Payer: Self-pay | Admitting: Family Medicine

## 2023-04-18 NOTE — Telephone Encounter (Signed)
Copied from CRM [id number]. Topic: Clinical - Prescription Issue >> Apr 18, 2023  1:18 PM Dimitri Ped wrote: Reason for CRM: Ms Anna Tanner calling about a prescription for this patient and would like to speak with Dr or nurse  concerning this prescription . Call back number [phone] option 8 for physician line . Bupropion xer 300 mg we have a script for 30 on quantity and she usually have 90 quantity. Wanting to know if they can do the 90 instead of the 30 and the price is the same . Cause she do usually get 90 quantity

## 2023-04-18 NOTE — Telephone Encounter (Signed)
Pt was last seen on June 19th and at that visit, it was established that pt would no longer see me as her PCP. She would be going to her psychiatrist for mental health medicines. My name has been removed from PCP for this pt back in June 2024.

## 2023-04-24 ENCOUNTER — Telehealth: Payer: Self-pay | Admitting: Family Medicine

## 2023-04-24 NOTE — Telephone Encounter (Signed)
Copied from CRM [id number]. Topic: Clinical - Prescription Issue >> Apr 23, 2023  5:36 PM Elle L wrote: Reason for CRM: The patient states that NP Dorena Bodo sent buPROPion (WELLBUTRIN XL) 300 MG 24 hr tablet to [facility] - [place], [place] - [address] [facility] [address] However, she states that she sent a 30 day supply but she needs a 90 day supply. She is concerned as she now sees [facility] and cannot afford the cost of both prescriptions.  She would like a callback at [phone]. She also wants to ensure that QUEtiapine (SEROQUEL) 200 MG tablet AND traZODone (DESYREL) 150 MG tablet are sent to her new provider as well.

## 2023-04-24 NOTE — Telephone Encounter (Signed)
Wellbutrin refill sent in October as a one time courtesy for 30 days. She will transfer all medications to her new provider with [facility], this is not done by previous providers as we do not have access to [facility] medical records.

## 2023-04-25 ENCOUNTER — Encounter: Payer: Self-pay | Admitting: Family Medicine

## 2023-04-25 ENCOUNTER — Telehealth: Payer: Self-pay | Admitting: Family Medicine

## 2023-04-25 NOTE — Telephone Encounter (Signed)
Message sent to patient explaining the nature of the refill that was sent for her in October. She will get all future medication refills through her new provider with [facility]. [facility] is no longer this patient's health care provider.

## 2023-04-25 NOTE — Telephone Encounter (Signed)
Copied from CRM [id number]. Topic: Clinical - Prescription Issue >> Apr 24, 2023  9:23 AM Dennison Nancy wrote: Reason for CRM: The patient states that NP Dorena Bodo sent buPROPion (WELLBUTRIN XL) 300 MG 24 hr tablet to [facility] - [place], [place] - [address] [facility] [address]   The issue is , she states that she sent a 30 day supply was charge $45 , Patient suppose to get a 90 day supply for $45 and to last 3 months and patient running out of medication on 05/01/23 and refuse to pay again when the supply should have been 90 days . And Patient would like to get this resolve .  Patient no longer see Dr. Wyline Mood only seen provider twice ,  Patient provider is Anette Riedel PA-C at [facility]  been with the provider since June .Marland Kitchen Patient concern who is Dorena Bodo how she was able to do the prescription refill   Patient stated she will be out of medication for the holidays . Patient callback # [phone]
